# Patient Record
Sex: Male | Born: 1955 | Race: White | Hispanic: No | Marital: Married | Smoking: Never smoker
Health system: Southern US, Community
[De-identification: ages and names within clinical notes are randomized; demographics above are authoritative.]

## PROBLEM LIST (undated history)

## (undated) DIAGNOSIS — S86009A Unspecified injury of unspecified Achilles tendon, initial encounter: Secondary | ICD-10-CM

## (undated) DIAGNOSIS — K219 Gastro-esophageal reflux disease without esophagitis: Secondary | ICD-10-CM

## (undated) DIAGNOSIS — Z8489 Family history of other specified conditions: Secondary | ICD-10-CM

## (undated) DIAGNOSIS — E119 Type 2 diabetes mellitus without complications: Secondary | ICD-10-CM

## (undated) DIAGNOSIS — I10 Essential (primary) hypertension: Secondary | ICD-10-CM

## (undated) DIAGNOSIS — J189 Pneumonia, unspecified organism: Secondary | ICD-10-CM

## (undated) DIAGNOSIS — M1990 Unspecified osteoarthritis, unspecified site: Secondary | ICD-10-CM

## (undated) HISTORY — DX: Unspecified injury of unspecified achilles tendon, initial encounter: S86.009A

## (undated) HISTORY — PX: TONSILLECTOMY: SUR1361

## (undated) HISTORY — PX: CHOLECYSTECTOMY: SHX55

## (undated) HISTORY — PX: APPENDECTOMY: SHX54

---

## 1990-05-23 DIAGNOSIS — Z8719 Personal history of other diseases of the digestive system: Secondary | ICD-10-CM

## 1990-05-23 HISTORY — DX: Personal history of other diseases of the digestive system: Z87.19

## 2011-05-24 HISTORY — PX: OTHER SURGICAL HISTORY: SHX169

## 2013-01-15 DIAGNOSIS — K861 Other chronic pancreatitis: Secondary | ICD-10-CM

## 2013-01-15 DIAGNOSIS — E785 Hyperlipidemia, unspecified: Secondary | ICD-10-CM

## 2013-01-15 HISTORY — DX: Hyperlipidemia, unspecified: E78.5

## 2013-01-15 HISTORY — DX: Other chronic pancreatitis: K86.1

## 2013-07-29 DIAGNOSIS — Z9884 Bariatric surgery status: Secondary | ICD-10-CM

## 2013-07-29 HISTORY — DX: Bariatric surgery status: Z98.84

## 2014-09-04 ENCOUNTER — Other Ambulatory Visit: Payer: Self-pay | Admitting: Gastroenterology

## 2014-09-04 DIAGNOSIS — K859 Acute pancreatitis without necrosis or infection, unspecified: Secondary | ICD-10-CM

## 2014-09-04 DIAGNOSIS — R109 Unspecified abdominal pain: Secondary | ICD-10-CM

## 2014-09-10 ENCOUNTER — Other Ambulatory Visit: Payer: Self-pay

## 2014-09-14 ENCOUNTER — Ambulatory Visit
Admission: RE | Admit: 2014-09-14 | Discharge: 2014-09-14 | Disposition: A | Discharge status: Home / Self Care | Payer: BLUE CROSS/BLUE SHIELD | Source: Ambulatory Visit | Attending: Gastroenterology | Admitting: Gastroenterology

## 2014-09-14 DIAGNOSIS — K859 Acute pancreatitis without necrosis or infection, unspecified: Secondary | ICD-10-CM

## 2014-09-14 DIAGNOSIS — R109 Unspecified abdominal pain: Secondary | ICD-10-CM

## 2014-09-14 MED ORDER — GADOBENATE DIMEGLUMINE 529 MG/ML IV SOLN
20.0000 mL | Freq: Once | INTRAVENOUS | Status: AC | PRN
  Administered 2014-09-14: 20 mL via INTRAVENOUS

## 2014-09-22 ENCOUNTER — Other Ambulatory Visit: Payer: Self-pay

## 2014-11-18 ENCOUNTER — Encounter (HOSPITAL_COMMUNITY): Payer: Self-pay | Admitting: *Deleted

## 2014-11-18 ENCOUNTER — Other Ambulatory Visit (HOSPITAL_COMMUNITY): Payer: Self-pay | Admitting: *Deleted

## 2014-11-18 ENCOUNTER — Other Ambulatory Visit: Payer: Self-pay | Admitting: Surgical

## 2014-11-18 NOTE — H&P (Signed)
Joshua Monroe is an 59 y.o. male.   Chief Complaint: left ankle pain HPI: The patient presented on 11/14/2014 with acute pain and dysfunction of his left ankle following an injury while playing tennis. Symptoms are reported to be located in the left posterior ankle and include ankle pain, decreased range of motion (limited dorsiflexion) and audible pop at the time of injury. The patient reports that symptoms radiate to the left lower leg (posterior). The patient describes their symptoms as mild (varies) and does feel that they are unchanged. Symptoms are exacerbated by weight bearing and walking (especially dorsiflexion). MRI showed a complete rupture of the Achilles tendon of the left ankle with retraction.   Allergies  SandoSTATIN *ENDOCRINE AND METABOLIC AGENTS - MISC.*  Family History  Diabetes Mellitus Mother.   Social History  Children 3 Current work status working full time Exercise Exercises weekly; does individual sport Living situation live with spouse Marital status married Tobacco / smoke exposure no Tobacco use Never smoker.  Current drinker- Currently drinks wine only occasionally per week   Past Medical History Diabetes Mellitus, Type I Hypertension   Medication History  HumaLOG (100UNIT/ML Soln Cartridge, Subcutaneous daily) Active. MetFORMIN HCl (500MG Tablet, Oral daily) Active. Aciphex (20MG Tablet DR, Oral daily) Active. Creon (Oral daily) Active. (1000 mg before meals) Ibuprofen (prn) Active. Metoprolol Succinate ER (50MG Tablet ER 24HR, Oral) Active  Review of Systems  Constitutional: Negative.   HENT: Negative.   Eyes: Negative.   Respiratory: Negative.   Cardiovascular: Negative.   Genitourinary: Negative.   Musculoskeletal: Positive for joint pain and falls. Negative for myalgias, back pain and neck pain.  Skin: Negative.   Neurological: Negative.   Endo/Heme/Allergies: Negative.   Psychiatric/Behavioral: Negative.    Vitals   Weight: 207 lb Height: 73in Body Surface Area: 2.18 m Body Mass Index: 27.31 kg/m  BP: 125/76 (Sitting, Left Arm, Standard) HR: 76 bpm  Physical Exam  Constitutional: He is oriented to person, place, and time. He appears well-developed and well-nourished. No distress.  HENT:  Head: Normocephalic and atraumatic.  Right Ear: External ear normal.  Left Ear: External ear normal.  Nose: Nose normal.  Mouth/Throat: Oropharynx is clear and moist.  Eyes: Conjunctivae and EOM are normal.  Neck: Normal range of motion.  Cardiovascular: Normal rate, regular rhythm, normal heart sounds and intact distal pulses.   No murmur heard. Respiratory: Effort normal and breath sounds normal. No respiratory distress. He has no wheezes.  GI: Soft. He exhibits no distension.  Musculoskeletal:       Right knee: Normal.       Left knee: Normal.       Right ankle: Normal.       Left ankle: Achilles tendon exhibits pain, defect and abnormal Thompson's test results.  Neurological: He is alert and oriented to person, place, and time. He has normal strength. No sensory deficit.  Skin: No rash noted. He is not diaphoretic. No erythema.  Psychiatric: He has a normal mood and affect. His behavior is normal.     Assessment/Plan Left Achilles tendon rupture with retraction He needs an open left Achilles tendon repair with graft and possibly anchors. Risks and benefits of the procedure discussed with the patient by Dr. Gioffre.   H&P performed by Dr. Ronald Gioffre, MD  Blong Busk LAUREN 11/18/2014, 10:51 AM    

## 2014-11-18 NOTE — Patient Instructions (Addendum)
Joshua Monroe  11/18/2014   Your procedure is scheduled on: Thursday June 30th, 2016  [facility]  Entrance take [place]  elevators to 3rd floor to  Short Stay Center at 1030 AM.  Call this number if you have problems the morning of surgery [phone]   Remember: ONLY 1 PERSON MAY GO WITH YOU TO SHORT STAY TO GET  READY MORNING OF YOUR SURGERY.  Do not eat food or drink liquids :After Midnight.   BRING INSULIN PUMP SUPPLIES   Take these medicines the morning of surgery with A SIP OF WATER: Aciphex, metoprolol succinate                               You may not have any metal on your body including hair pins and              piercings  Do not wear jewelry, make-up, lotions, powders or perfumes, deodorant             Do not wear nail polish.  Do not shave  48 hours prior to surgery.              Men may shave face and neck.   Do not bring valuables to the hospital. [facility] IS NOT             RESPONSIBLE   FOR VALUABLES.  Contacts, dentures or bridgework may not be worn into surgery.  Leave suitcase in the car. After surgery it may be brought to your room.                  Please read over the following fact sheets you were given: _____________________________________________________________________             [facility] - Pre[facility] Before surgery, you can play an important role.  Because skin is not sterile, your skin needs to be as free of germs as possible.  You can reduce the number of germs on your skin by washing with CHG (chlorahexidine gluconate) soap before surgery.  CHG is an antiseptic cleaner which kills germs and bonds with the skin to continue killing germs even after washing. Please DO NOT use if you have an allergy to CHG or antibacterial soaps.  If your skin becomes reddened/irritated stop using the CHG and inform your nurse when you arrive at Short Stay. Do not shave (including legs and underarms) for at least  48 hours prior to the first CHG shower.  You may shave your face/neck. Please follow these instructions carefully:  1.  Shower with CHG Soap the night before surgery and the  morning of Surgery.  2.  If you choose to wash your hair, wash your hair first as usual with your  normal  shampoo.  3.  After you shampoo, rinse your hair and body thoroughly to remove the  shampoo.                           4.  Use CHG as you would any other liquid soap.  You can apply chg directly  to the skin and wash                       Gently with a scrungie or clean washcloth.  5.  Apply the CHG Soap to your body ONLY FROM THE NECK DOWN.   Do not use on face/ open                           Wound or open sores. Avoid contact with eyes, ears mouth and genitals (private parts).                       Wash face,  Genitals (private parts) with your normal soap.             6.  Wash thoroughly, paying special attention to the area where your surgery  will be performed.  7.  Thoroughly rinse your body with warm water from the neck down.  8.  DO NOT shower/wash with your normal soap after using and rinsing off  the CHG Soap.                9.  Pat yourself dry with a clean towel.            10.  Wear clean pajamas.            11.  Place clean sheets on your bed the night of your first shower and do not  sleep with pets. Day of Surgery : Do not apply any lotions/deodorants the morning of surgery.  Please wear clean clothes to the hospital/surgery center.  FAILURE TO FOLLOW THESE INSTRUCTIONS MAY RESULT IN THE CANCELLATION OF YOUR SURGERY PATIENT SIGNATURE_________________________________  NURSE SIGNATURE__________________________________  ________________________________________________________________________   Joshua Monroe  An incentive spirometer is a tool that can help keep your lungs clear and active. This tool measures how well you are filling your lungs with each breath. Taking long deep breaths may  help reverse or decrease the chance of developing breathing (pulmonary) problems (especially infection) following:  A long period of time when you are unable to move or be active. BEFORE THE PROCEDURE   If the spirometer includes an indicator to show your best effort, your nurse or respiratory therapist will set it to a desired goal.  If possible, sit up straight or lean slightly forward. Try not to slouch.  Hold the incentive spirometer in an upright position. INSTRUCTIONS FOR USE  1. Sit on the edge of your bed if possible, or sit up as far as you can in bed or on a chair. 2. Hold the incentive spirometer in an upright position. 3. Breathe out normally. 4. Place the mouthpiece in your mouth and seal your lips tightly around it. 5. Breathe in slowly and as deeply as possible, raising the piston or the ball toward the top of the column. 6. Hold your breath for 3-5 seconds or for as long as possible. Allow the piston or ball to fall to the bottom of the column. 7. Remove the mouthpiece from your mouth and breathe out normally. 8. Rest for a few seconds and repeat Steps 1 through 7 at least 10 times every 1-2 hours when you are awake. Take your time and take a few normal breaths between deep breaths. 9. The spirometer may include an indicator to show your best effort. Use the indicator as a goal to work toward during each repetition. 10. After each set of 10 deep breaths, practice coughing to be sure your lungs are clear. If you have an incision (the cut made at the time of surgery), support your incision when coughing by placing a pillow or  rolled up towels firmly against it. Once you are able to get out of bed, walk around indoors and cough well. You may stop using the incentive spirometer when instructed by your caregiver.  RISKS AND COMPLICATIONS  Take your time so you do not get dizzy or light-headed.  If you are in pain, you may need to take or ask for pain medication before doing  incentive spirometry. It is harder to take a deep breath if you are having pain. AFTER USE  Rest and breathe slowly and easily.  It can be helpful to keep track of a log of your progress. Your caregiver can provide you with a simple table to help with this. If you are using the spirometer at home, follow these instructions: [place] IF:   You are having difficultly using the spirometer.  You have trouble using the spirometer as often as instructed.  Your pain medication is not giving enough relief while using the spirometer.  You develop fever of 100.5 F (38.1 C) or higher. SEEK IMMEDIATE MEDICAL CARE IF:   You cough up bloody sputum that had not been present before.  You develop fever of 102 F (38.9 C) or greater.  You develop worsening pain at or near the incision site. MAKE SURE YOU:   Understand these instructions.  Will watch your condition.  Will get help right away if you are not doing well or get worse. Document Released: 09/19/2006 Document Revised: 08/01/2011 Document Reviewed: 11/20/2006 [facility] Patient Information 2014 [place], [place].   ________________________________________________________________________

## 2014-11-19 ENCOUNTER — Encounter (HOSPITAL_COMMUNITY)
Admission: RE | Admit: 2014-11-19 | Discharge: 2014-11-19 | Disposition: A | Discharge status: Home / Self Care | Payer: BLUE CROSS/BLUE SHIELD | Source: Ambulatory Visit | Attending: Orthopedic Surgery | Admitting: Orthopedic Surgery

## 2014-11-19 ENCOUNTER — Encounter (HOSPITAL_COMMUNITY): Payer: Self-pay

## 2014-11-19 DIAGNOSIS — Y9373 Activity, racquet and hand sports: Secondary | ICD-10-CM | POA: Diagnosis not present

## 2014-11-19 DIAGNOSIS — Y92312 Tennis court as the place of occurrence of the external cause: Secondary | ICD-10-CM | POA: Diagnosis not present

## 2014-11-19 DIAGNOSIS — Z79899 Other long term (current) drug therapy: Secondary | ICD-10-CM | POA: Diagnosis not present

## 2014-11-19 DIAGNOSIS — I10 Essential (primary) hypertension: Secondary | ICD-10-CM | POA: Diagnosis not present

## 2014-11-19 DIAGNOSIS — E109 Type 1 diabetes mellitus without complications: Secondary | ICD-10-CM | POA: Diagnosis not present

## 2014-11-19 DIAGNOSIS — X58XXXA Exposure to other specified factors, initial encounter: Secondary | ICD-10-CM | POA: Diagnosis not present

## 2014-11-19 DIAGNOSIS — Z794 Long term (current) use of insulin: Secondary | ICD-10-CM | POA: Diagnosis not present

## 2014-11-19 DIAGNOSIS — S86012A Strain of left Achilles tendon, initial encounter: Secondary | ICD-10-CM | POA: Diagnosis not present

## 2014-11-19 LAB — CBC WITH DIFFERENTIAL/PLATELET
Basophils Absolute: 0 10*3/uL (ref 0.0–0.1)
Basophils Relative: 0 % (ref 0–1)
Eosinophils Absolute: 0.1 10*3/uL (ref 0.0–0.7)
Eosinophils Relative: 1 % (ref 0–5)
HCT: 36.5 % — ABNORMAL LOW (ref 39.0–52.0)
Hemoglobin: 11.6 g/dL — ABNORMAL LOW (ref 13.0–17.0)
Lymphocytes Relative: 10 % — ABNORMAL LOW (ref 12–46)
Lymphs Abs: 1.2 10*3/uL (ref 0.7–4.0)
MCH: 26.2 pg (ref 26.0–34.0)
MCHC: 31.8 g/dL (ref 30.0–36.0)
MCV: 82.4 fL (ref 78.0–100.0)
Monocytes Absolute: 0.9 10*3/uL (ref 0.1–1.0)
Monocytes Relative: 7 % (ref 3–12)
Neutro Abs: 10.1 10*3/uL — ABNORMAL HIGH (ref 1.7–7.7)
Neutrophils Relative %: 82 % — ABNORMAL HIGH (ref 43–77)
Platelets: 233 10*3/uL (ref 150–400)
RBC: 4.43 MIL/uL (ref 4.22–5.81)
RDW: 14.1 % (ref 11.5–15.5)
WBC: 12.3 10*3/uL — ABNORMAL HIGH (ref 4.0–10.5)

## 2014-11-19 LAB — COMPREHENSIVE METABOLIC PANEL
ALT: 15 U/L — ABNORMAL LOW (ref 17–63)
AST: 20 U/L (ref 15–41)
Albumin: 4.2 g/dL (ref 3.5–5.0)
Alkaline Phosphatase: 87 U/L (ref 38–126)
Anion gap: 10 (ref 5–15)
BUN: 26 mg/dL — ABNORMAL HIGH (ref 6–20)
CO2: 24 mmol/L (ref 22–32)
Calcium: 9.7 mg/dL (ref 8.9–10.3)
Chloride: 105 mmol/L (ref 101–111)
Creatinine, Ser: 1.02 mg/dL (ref 0.61–1.24)
GFR calc Af Amer: >60 mL/min (ref >60)
GFR calc non Af Amer: >60 mL/min (ref >60)
Glucose, Bld: 165 mg/dL — ABNORMAL HIGH (ref 65–99)
Potassium: 4.6 mmol/L (ref 3.5–5.1)
Sodium: 139 mmol/L (ref 135–145)
Total Bilirubin: 0.6 mg/dL (ref 0.3–1.2)
Total Protein: 7.2 g/dL (ref 6.5–8.1)

## 2014-11-19 LAB — PROTIME-INR
INR: 1.03 (ref 0.00–1.49)
Prothrombin Time: 13.7 seconds (ref 11.6–15.2)

## 2014-11-19 NOTE — Progress Notes (Signed)
Patient instructed to bring own insulin pump supplies and to leave pump running at basal rate  pre op phone call on 11-18-14 made diabetic coordinator jenny simpson  aware of patient surgery 11-20-14 1230 pm  Overnight admission.

## 2014-11-20 ENCOUNTER — Ambulatory Visit (HOSPITAL_COMMUNITY): Payer: BLUE CROSS/BLUE SHIELD | Admitting: Anesthesiology

## 2014-11-20 ENCOUNTER — Observation Stay (HOSPITAL_COMMUNITY)
Admission: RE | Admit: 2014-11-20 | Discharge: 2014-11-21 | Disposition: A | Discharge status: Home / Self Care | Payer: BLUE CROSS/BLUE SHIELD | Source: Ambulatory Visit | Attending: Orthopedic Surgery | Admitting: Orthopedic Surgery

## 2014-11-20 ENCOUNTER — Encounter (HOSPITAL_COMMUNITY): Payer: Self-pay | Admitting: *Deleted

## 2014-11-20 ENCOUNTER — Encounter (HOSPITAL_COMMUNITY)
Admission: RE | Disposition: A | Discharge status: Home / Self Care | Payer: Self-pay | Source: Ambulatory Visit | Attending: Orthopedic Surgery

## 2014-11-20 DIAGNOSIS — Y92312 Tennis court as the place of occurrence of the external cause: Secondary | ICD-10-CM | POA: Insufficient documentation

## 2014-11-20 DIAGNOSIS — Y9373 Activity, racquet and hand sports: Secondary | ICD-10-CM | POA: Insufficient documentation

## 2014-11-20 DIAGNOSIS — Z79899 Other long term (current) drug therapy: Secondary | ICD-10-CM | POA: Insufficient documentation

## 2014-11-20 DIAGNOSIS — E109 Type 1 diabetes mellitus without complications: Secondary | ICD-10-CM | POA: Insufficient documentation

## 2014-11-20 DIAGNOSIS — S86012A Strain of left Achilles tendon, initial encounter: Secondary | ICD-10-CM | POA: Diagnosis not present

## 2014-11-20 DIAGNOSIS — I10 Essential (primary) hypertension: Secondary | ICD-10-CM | POA: Insufficient documentation

## 2014-11-20 DIAGNOSIS — X58XXXA Exposure to other specified factors, initial encounter: Secondary | ICD-10-CM | POA: Insufficient documentation

## 2014-11-20 DIAGNOSIS — S86009A Unspecified injury of unspecified Achilles tendon, initial encounter: Secondary | ICD-10-CM

## 2014-11-20 DIAGNOSIS — S86002D Unspecified injury of left Achilles tendon, subsequent encounter: Secondary | ICD-10-CM

## 2014-11-20 DIAGNOSIS — Z794 Long term (current) use of insulin: Secondary | ICD-10-CM | POA: Insufficient documentation

## 2014-11-20 HISTORY — DX: Essential (primary) hypertension: I10

## 2014-11-20 HISTORY — DX: Type 2 diabetes mellitus without complications: E11.9

## 2014-11-20 HISTORY — PX: ACHILLES TENDON SURGERY: SHX542

## 2014-11-20 HISTORY — DX: Unspecified injury of unspecified achilles tendon, initial encounter: S86.009A

## 2014-11-20 HISTORY — DX: Unspecified osteoarthritis, unspecified site: M19.90

## 2014-11-20 HISTORY — DX: Gastro-esophageal reflux disease without esophagitis: K21.9

## 2014-11-20 LAB — CBC
HCT: 32.8 % — ABNORMAL LOW (ref 39.0–52.0)
HEMOGLOBIN: 10.5 g/dL — AB (ref 13.0–17.0)
MCH: 26.2 pg (ref 26.0–34.0)
MCHC: 32 g/dL (ref 30.0–36.0)
MCV: 81.8 fL (ref 78.0–100.0)
Platelets: 187 10*3/uL (ref 150–400)
RBC: 4.01 MIL/uL — ABNORMAL LOW (ref 4.22–5.81)
RDW: 14 % (ref 11.5–15.5)
WBC: 7.4 10*3/uL (ref 4.0–10.5)

## 2014-11-20 LAB — CREATININE, SERUM
Creatinine, Ser: 1.01 mg/dL (ref 0.61–1.24)
GFR calc Af Amer: >60 mL/min (ref >60)
GFR calc non Af Amer: >60 mL/min (ref >60)

## 2014-11-20 LAB — GLUCOSE, CAPILLARY
Glucose-Capillary: 172 mg/dL — ABNORMAL HIGH (ref 65–99)
Glucose-Capillary: 92 mg/dL (ref 65–99)
Glucose-Capillary: 78 mg/dL (ref 65–99)
Glucose-Capillary: 88 mg/dL (ref 65–99)
Glucose-Capillary: 99 mg/dL (ref 65–99)
Glucose-Capillary: 115 mg/dL — ABNORMAL HIGH (ref 65–99)

## 2014-11-20 SURGERY — REPAIR, TENDON, ACHILLES
Anesthesia: General | Laterality: Left

## 2014-11-20 MED ORDER — SODIUM CHLORIDE 0.9 % IR SOLN
Status: DC | PRN
  Administered 2014-11-20: 500 mL

## 2014-11-20 MED ORDER — ONDANSETRON HCL 4 MG/2ML IJ SOLN
INTRAMUSCULAR | Status: AC
  Filled 2014-11-20: qty 2

## 2014-11-20 MED ORDER — METOPROLOL SUCCINATE ER 50 MG PO TB24
50.0000 mg | ORAL_TABLET | Freq: Every day | ORAL | Status: DC
  Administered 2014-11-21: 50 mg via ORAL
  Filled 2014-11-20 (×2): qty 1

## 2014-11-20 MED ORDER — BUPIVACAINE HCL (PF) 0.5 % IJ SOLN
INTRAMUSCULAR | Status: AC
Start: 2014-11-20 — End: 2014-11-20
  Filled 2014-11-20: qty 30

## 2014-11-20 MED ORDER — METHOCARBAMOL 500 MG PO TABS
500.0000 mg | ORAL_TABLET | Freq: Four times a day (QID) | ORAL | Status: DC | PRN
  Administered 2014-11-20 – 2014-11-21 (×2): 500 mg via ORAL
  Filled 2014-11-20 (×2): qty 1

## 2014-11-20 MED ORDER — POLYETHYLENE GLYCOL 3350 17 G PO PACK: 17.0000 g | PACK | Freq: Every day | ORAL | Status: DC | PRN

## 2014-11-20 MED ORDER — LIDOCAINE HCL (CARDIAC) 20 MG/ML IV SOLN
INTRAVENOUS | Status: DC | PRN
  Administered 2014-11-20: 50 mg via INTRAVENOUS

## 2014-11-20 MED ORDER — CEFAZOLIN SODIUM-DEXTROSE 2-3 GM-% IV SOLR
2.0000 g | INTRAVENOUS | Status: AC
  Administered 2014-11-20: 2 g via INTRAVENOUS

## 2014-11-20 MED ORDER — EPHEDRINE SULFATE 50 MG/ML IJ SOLN
INTRAMUSCULAR | Status: DC | PRN
  Administered 2014-11-20: 5 mg via INTRAVENOUS

## 2014-11-20 MED ORDER — INSULIN ASPART 100 UNIT/ML ~~LOC~~ SOLN: 0.0000 [IU] | Freq: Three times a day (TID) | SUBCUTANEOUS | Status: DC

## 2014-11-20 MED ORDER — GLYCOPYRROLATE 0.2 MG/ML IJ SOLN
INTRAMUSCULAR | Status: AC
  Filled 2014-11-20: qty 3

## 2014-11-20 MED ORDER — ACETAMINOPHEN 650 MG RE SUPP: 650.0000 mg | Freq: Four times a day (QID) | RECTAL | Status: DC | PRN

## 2014-11-20 MED ORDER — HYDROMORPHONE HCL 1 MG/ML IJ SOLN: 0.2500 mg | INTRAMUSCULAR | Status: DC | PRN

## 2014-11-20 MED ORDER — LACTATED RINGERS IV SOLN: INTRAVENOUS | Status: DC

## 2014-11-20 MED ORDER — MIDAZOLAM HCL 5 MG/5ML IJ SOLN
INTRAMUSCULAR | Status: DC | PRN
  Administered 2014-11-20: 2 mg via INTRAVENOUS

## 2014-11-20 MED ORDER — METHOCARBAMOL 1000 MG/10ML IJ SOLN
500.0000 mg | Freq: Four times a day (QID) | INTRAVENOUS | Status: DC | PRN
  Administered 2014-11-20: 500 mg via INTRAVENOUS
  Filled 2014-11-20 (×3): qty 5

## 2014-11-20 MED ORDER — PROPOFOL 10 MG/ML IV BOLUS
INTRAVENOUS | Status: DC | PRN
  Administered 2014-11-20: 180 mg via INTRAVENOUS

## 2014-11-20 MED ORDER — HYDROCODONE-ACETAMINOPHEN 5-325 MG PO TABS
1.0000 | ORAL_TABLET | ORAL | Status: DC | PRN
  Administered 2014-11-20: 2 via ORAL
  Administered 2014-11-20: 1 via ORAL
  Administered 2014-11-21: 2 via ORAL
  Filled 2014-11-20: qty 1
  Filled 2014-11-20 (×2): qty 2

## 2014-11-20 MED ORDER — METOCLOPRAMIDE HCL 10 MG PO TABS: 5.0000 mg | ORAL_TABLET | Freq: Three times a day (TID) | ORAL | Status: DC | PRN

## 2014-11-20 MED ORDER — INSULIN PUMP
Freq: Three times a day (TID) | SUBCUTANEOUS | Status: DC
  Administered 2014-11-20: 4 via SUBCUTANEOUS
  Administered 2014-11-20 – 2014-11-21 (×2): via SUBCUTANEOUS
  Filled 2014-11-20: qty 1

## 2014-11-20 MED ORDER — ONDANSETRON HCL 4 MG/2ML IJ SOLN
INTRAMUSCULAR | Status: DC | PRN
  Administered 2014-11-20: 4 mg via INTRAVENOUS

## 2014-11-20 MED ORDER — METOCLOPRAMIDE HCL 5 MG/ML IJ SOLN: 5.0000 mg | Freq: Three times a day (TID) | INTRAMUSCULAR | Status: DC | PRN

## 2014-11-20 MED ORDER — LIDOCAINE HCL (CARDIAC) 20 MG/ML IV SOLN
INTRAVENOUS | Status: AC
  Filled 2014-11-20: qty 5

## 2014-11-20 MED ORDER — ROCURONIUM BROMIDE 100 MG/10ML IV SOLN
INTRAVENOUS | Status: DC | PRN
  Administered 2014-11-20: 40 mg via INTRAVENOUS

## 2014-11-20 MED ORDER — FENTANYL CITRATE (PF) 250 MCG/5ML IJ SOLN
INTRAMUSCULAR | Status: AC
Start: 2014-11-20 — End: 2014-11-20
  Filled 2014-11-20: qty 5

## 2014-11-20 MED ORDER — LACTATED RINGERS IV SOLN
INTRAVENOUS | Status: DC
  Administered 2014-11-20: 1000 mL via INTRAVENOUS

## 2014-11-20 MED ORDER — ENOXAPARIN SODIUM 40 MG/0.4ML ~~LOC~~ SOLN
40.0000 mg | SUBCUTANEOUS | Status: DC
  Administered 2014-11-21: 40 mg via SUBCUTANEOUS
  Filled 2014-11-20 (×2): qty 0.4

## 2014-11-20 MED ORDER — GLYCOPYRROLATE 0.2 MG/ML IJ SOLN
INTRAMUSCULAR | Status: DC | PRN
  Administered 2014-11-20: .6 mg via INTRAVENOUS

## 2014-11-20 MED ORDER — MIDAZOLAM HCL 2 MG/2ML IJ SOLN
INTRAMUSCULAR | Status: AC
Start: 2014-11-20 — End: 2014-11-20
  Filled 2014-11-20: qty 2

## 2014-11-20 MED ORDER — PANTOPRAZOLE SODIUM 40 MG PO TBEC
40.0000 mg | DELAYED_RELEASE_TABLET | Freq: Every day | ORAL | Status: DC
  Administered 2014-11-21: 40 mg via ORAL
  Filled 2014-11-20: qty 1

## 2014-11-20 MED ORDER — SODIUM CHLORIDE 0.9 % IR SOLN
Status: AC
  Filled 2014-11-20: qty 1

## 2014-11-20 MED ORDER — BUPIVACAINE HCL (PF) 0.5 % IJ SOLN
INTRAMUSCULAR | Status: DC | PRN
  Administered 2014-11-20: 7 mL

## 2014-11-20 MED ORDER — BACITRACIN-NEOMYCIN-POLYMYXIN 400-5-5000 EX OINT
TOPICAL_OINTMENT | CUTANEOUS | Status: AC
  Filled 2014-11-20: qty 1

## 2014-11-20 MED ORDER — EPHEDRINE SULFATE 50 MG/ML IJ SOLN
INTRAMUSCULAR | Status: AC
  Filled 2014-11-20: qty 1

## 2014-11-20 MED ORDER — HYDROMORPHONE HCL 1 MG/ML IJ SOLN
0.5000 mg | INTRAMUSCULAR | Status: DC | PRN
  Administered 2014-11-20 (×2): 0.5 mg via INTRAVENOUS
  Filled 2014-11-20 (×2): qty 1

## 2014-11-20 MED ORDER — LACTATED RINGERS IV SOLN
INTRAVENOUS | Status: DC
  Administered 2014-11-20: 12:00:00 via INTRAVENOUS

## 2014-11-20 MED ORDER — ACETAMINOPHEN 325 MG PO TABS
650.0000 mg | ORAL_TABLET | Freq: Four times a day (QID) | ORAL | Status: DC | PRN
  Administered 2014-11-21: 650 mg via ORAL
  Filled 2014-11-20: qty 2

## 2014-11-20 MED ORDER — FLEET ENEMA 7-19 GM/118ML RE ENEM: 1.0000 | ENEMA | Freq: Once | RECTAL | Status: AC | PRN

## 2014-11-20 MED ORDER — CEFAZOLIN SODIUM-DEXTROSE 2-3 GM-% IV SOLR
INTRAVENOUS | Status: AC
  Filled 2014-11-20: qty 50

## 2014-11-20 MED ORDER — NEOSTIGMINE METHYLSULFATE 10 MG/10ML IV SOLN
INTRAVENOUS | Status: DC | PRN
  Administered 2014-11-20: 4 mg via INTRAVENOUS

## 2014-11-20 MED ORDER — ONDANSETRON HCL 4 MG PO TABS: 4.0000 mg | ORAL_TABLET | Freq: Four times a day (QID) | ORAL | Status: DC | PRN

## 2014-11-20 MED ORDER — NEOSTIGMINE METHYLSULFATE 10 MG/10ML IV SOLN
INTRAVENOUS | Status: AC
Start: 2014-11-20 — End: 2014-11-20
  Filled 2014-11-20: qty 1

## 2014-11-20 MED ORDER — CEFAZOLIN SODIUM 1-5 GM-% IV SOLN
1.0000 g | Freq: Four times a day (QID) | INTRAVENOUS | Status: AC
  Administered 2014-11-20 – 2014-11-21 (×3): 1 g via INTRAVENOUS
  Filled 2014-11-20 (×3): qty 50

## 2014-11-20 MED ORDER — LACTATED RINGERS IV SOLN
INTRAVENOUS | Status: DC
  Administered 2014-11-20: 19:00:00 via INTRAVENOUS

## 2014-11-20 MED ORDER — PROPOFOL 10 MG/ML IV BOLUS
INTRAVENOUS | Status: AC
  Filled 2014-11-20: qty 20

## 2014-11-20 MED ORDER — OXYCODONE-ACETAMINOPHEN 5-325 MG PO TABS: 2.0000 | ORAL_TABLET | ORAL | Status: DC | PRN

## 2014-11-20 MED ORDER — ONDANSETRON HCL 4 MG/2ML IJ SOLN: 4.0000 mg | Freq: Four times a day (QID) | INTRAMUSCULAR | Status: DC | PRN

## 2014-11-20 MED ORDER — BISACODYL 5 MG PO TBEC: 5.0000 mg | DELAYED_RELEASE_TABLET | Freq: Every day | ORAL | Status: DC | PRN

## 2014-11-20 MED ORDER — FENTANYL CITRATE (PF) 100 MCG/2ML IJ SOLN
INTRAMUSCULAR | Status: DC | PRN
  Administered 2014-11-20: 100 ug via INTRAVENOUS
  Administered 2014-11-20 (×3): 50 ug via INTRAVENOUS

## 2014-11-20 SURGICAL SUPPLY — 52 items
BAG ZIPLOCK 12X15 (MISCELLANEOUS) ×3 IMPLANT
BANDAGE ESMARK 6X9 LF (GAUZE/BANDAGES/DRESSINGS) ×1 IMPLANT
BENZOIN TINCTURE PRP APPL 2/3 (GAUZE/BANDAGES/DRESSINGS) IMPLANT
BIT DRILL 1.6MX128 (BIT) ×2 IMPLANT
BIT DRILL 1.6MX128MM (BIT) ×1
BIT DRILL 2.8X128 (BIT) ×2 IMPLANT
BIT DRILL 2.8X128MM (BIT) ×1
BNDG ESMARK 6X9 LF (GAUZE/BANDAGES/DRESSINGS) ×3
CLOSURE WOUND 1/2 X4 (GAUZE/BANDAGES/DRESSINGS)
CUFF TOURN SGL QUICK 34 (TOURNIQUET CUFF) ×2
CUFF TRNQT CYL 34X4X40X1 (TOURNIQUET CUFF) ×1 IMPLANT
DERMASPAN .5-.9MM 4X4CM SHOU (Miscellaneous) ×3 IMPLANT
DRSG EMULSION OIL 3X16 NADH (GAUZE/BANDAGES/DRESSINGS) ×3 IMPLANT
DRSG PAD ABDOMINAL 8X10 ST (GAUZE/BANDAGES/DRESSINGS) ×3 IMPLANT
DURAPREP 26ML APPLICATOR (WOUND CARE) ×3 IMPLANT
ELECT REM PT RETURN 9FT ADLT (ELECTROSURGICAL) ×6
ELECTRODE REM PT RTRN 9FT ADLT (ELECTROSURGICAL) ×2 IMPLANT
GAUZE SPONGE 4X4 12PLY STRL (GAUZE/BANDAGES/DRESSINGS) ×3 IMPLANT
GLOVE BIOGEL PI IND STRL 6.5 (GLOVE) ×2 IMPLANT
GLOVE BIOGEL PI IND STRL 8 (GLOVE) ×1 IMPLANT
GLOVE BIOGEL PI INDICATOR 6.5 (GLOVE) ×4
GLOVE BIOGEL PI INDICATOR 8 (GLOVE) ×2
GLOVE ECLIPSE 8.0 STRL XLNG CF (GLOVE) ×6 IMPLANT
GOWN STRL REUS W/TWL LRG LVL3 (GOWN DISPOSABLE) ×3 IMPLANT
GOWN STRL REUS W/TWL XL LVL3 (GOWN DISPOSABLE) ×6 IMPLANT
KIT ANCHOR IMP ACH MID SPEED P (Orthopedic Implant) ×3 IMPLANT
MANIFOLD NEPTUNE II (INSTRUMENTS) ×3 IMPLANT
NEEDLE HYPO 22GX1.5 SAFETY (NEEDLE) ×3 IMPLANT
NEEDLE MAYO .5 CIRCLE (NEEDLE) ×3 IMPLANT
PACK ORTHO EXTREMITY (CUSTOM PROCEDURE TRAY) ×3 IMPLANT
PAD ABD 8X10 STRL (GAUZE/BANDAGES/DRESSINGS) ×3 IMPLANT
PAD CAST 4YDX4 CTTN HI CHSV (CAST SUPPLIES) ×2 IMPLANT
PADDING CAST ABS 4INX4YD NS (CAST SUPPLIES) ×4
PADDING CAST ABS COTTON 4X4 ST (CAST SUPPLIES) ×2 IMPLANT
PADDING CAST COTTON 4X4 STRL (CAST SUPPLIES) ×4
PADDING CAST COTTON 6X4 STRL (CAST SUPPLIES) IMPLANT
PASSER SUT SWANSON 36MM LOOP (INSTRUMENTS) IMPLANT
POSITIONER SURGICAL ARM (MISCELLANEOUS) ×3 IMPLANT
SCOTCHCAST PLUS 4X4 WHITE (CAST SUPPLIES) IMPLANT
SPLINT FIBERGLASS 3X35 (CAST SUPPLIES) ×3 IMPLANT
STAPLER VISISTAT (STAPLE) ×3 IMPLANT
STRIP CLOSURE SKIN 1/2X4 (GAUZE/BANDAGES/DRESSINGS) IMPLANT
SUCTION FRAZIER 12FR DISP (SUCTIONS) ×3 IMPLANT
SUT ETHIBOND NAB CT1 #1 30IN (SUTURE) ×9 IMPLANT
SUT ETHILON 3 0 PS 1 (SUTURE) ×12 IMPLANT
SUT VIC AB 1 CT1 27 (SUTURE) ×4
SUT VIC AB 1 CT1 27XBRD ANTBC (SUTURE) ×2 IMPLANT
SUT VIC AB 2-0 CT1 27 (SUTURE) ×4
SUT VIC AB 2-0 CT1 TAPERPNT 27 (SUTURE) ×2 IMPLANT
SYR CONTROL 10ML LL (SYRINGE) ×3 IMPLANT
TOWEL OR 17X26 10 PK STRL BLUE (TOWEL DISPOSABLE) ×3 IMPLANT
TOWEL OR NON WOVEN STRL DISP B (DISPOSABLE) ×3 IMPLANT

## 2014-11-20 NOTE — Anesthesia Postprocedure Evaluation (Signed)
  Anesthesia Post-op Note  Patient: Joshua Monroe  Procedure(s) Performed: Procedure(s) (LRB): OPEN REPAIR OF A LEFT ACHILLES TENDON RUPTURE (Left)  Patient Location: PACU  Anesthesia Type: General  Level of Consciousness: awake and alert   Airway and Oxygen Therapy: Patient Spontanous Breathing  Post-op Pain: mild  Post-op Assessment: Post-op Vital signs reviewed, Patient's Cardiovascular Status Stable, Respiratory Function Stable, Patent Airway and No signs of Nausea or vomiting  Last Vitals:  Filed Vitals:   11/20/14 1513  BP: 133/66  Pulse: 60  Temp: 36.4 C  Resp:     Post-op Vital Signs: stable   Complications: No apparent anesthesia complications

## 2014-11-20 NOTE — Anesthesia Preprocedure Evaluation (Addendum)
Anesthesia Evaluation  Patient identified by MRN, date of birth, ID band Patient awake    Reviewed: Allergy & Precautions, H&P , NPO status , Patient's Chart, lab work & pertinent test results, reviewed documented beta blocker date and time   Airway Mallampati: II  TM Distance: >3 FB Neck ROM: full    Dental  (+) Dental Advisory Given, Caps Left upper front capped:   Pulmonary neg pulmonary ROS,  breath sounds clear to auscultation  Pulmonary exam normal       Cardiovascular Exercise Tolerance: Good hypertension, Pt. on home beta blockers Normal cardiovascular examRhythm:regular Rate:Normal     Neuro/Psych negative neurological ROS  negative psych ROS   GI/Hepatic negative GI ROS, Neg liver ROS, GERD-  Medicated and Controlled,  Endo/Other  diabetes, Well Controlled, Type 1, Insulin DependentInsulin pump  Renal/GU negative Renal ROS  negative genitourinary   Musculoskeletal   Abdominal   Peds  Hematology negative hematology ROS (+)   Anesthesia Other Findings   Reproductive/Obstetrics negative OB ROS                            Anesthesia Physical Anesthesia Plan  ASA: III  Anesthesia Plan: General   Post-op Pain Management:    Induction: Intravenous  Airway Management Planned: Oral ETT  Additional Equipment:   Intra-op Plan:   Post-operative Plan: Extubation in OR  Informed Consent: I have reviewed the patients History and Physical, chart, labs and discussed the procedure including the risks, benefits and alternatives for the proposed anesthesia with the patient or authorized representative who has indicated his/her understanding and acceptance.   Dental Advisory Given  Plan Discussed with: CRNA and Surgeon  Anesthesia Plan Comments:         Anesthesia Quick Evaluation

## 2014-11-20 NOTE — Anesthesia Procedure Notes (Signed)
Procedure Name: Intubation Date/Time: 11/20/2014 12:33 PM Performed by: Paris LoreBLANTON, Akul Leggette M Pre-anesthesia Checklist: Patient identified, Emergency Drugs available, Suction available, Patient being monitored and Timeout performed Patient Re-evaluated:Patient Re-evaluated prior to inductionOxygen Delivery Method: Circle system utilized Preoxygenation: Pre-oxygenation with 100% oxygen Intubation Type: IV induction Ventilation: Mask ventilation without difficulty Laryngoscope Size: Mac and 4 Grade View: Grade II Tube type: Oral Tube size: 7.5 mm Number of attempts: 1 Airway Equipment and Method: Stylet Placement Confirmation: ETT inserted through vocal cords under direct vision,  positive ETCO2,  CO2 detector and breath sounds checked- equal and bilateral Secured at: 23 cm Tube secured with: Tape Dental Injury: Teeth and Oropharynx as per pre-operative assessment

## 2014-11-20 NOTE — H&P (View-Only) (Signed)
Joshua LericheMark Biggins is an 59 y.o. male.   Chief Complaint: left ankle pain HPI: The patient presented on 11/14/2014 with acute pain and dysfunction of his left ankle following an injury while playing tennis. Symptoms are reported to be located in the left posterior ankle and include ankle pain, decreased range of motion (limited dorsiflexion) and audible pop at the time of injury. The patient reports that symptoms radiate to the left lower leg (posterior). The patient describes their symptoms as mild (varies) and does feel that they are unchanged. Symptoms are exacerbated by weight bearing and walking (especially dorsiflexion). MRI showed a complete rupture of the Achilles tendon of the left ankle with retraction.   Allergies  SandoSTATIN *ENDOCRINE AND METABOLIC AGENTS - MISC.*  Family History  Diabetes Mellitus Mother.   Social History  Children 3 Current work status working full time Biochemist, clinicalxercise Exercises weekly; does individual sport Living situation live with spouse Marital status married Tobacco / smoke exposure no Tobacco use Never smoker.  Current drinker- Currently drinks wine only occasionally per week   Past Medical History Diabetes Mellitus, Type I Hypertension   Medication History  HumaLOG (100UNIT/ML Soln Cartridge, Subcutaneous daily) Active. MetFORMIN HCl (500MG  Tablet, Oral daily) Active. Aciphex (20MG  Tablet DR, Oral daily) Active. Creon (Oral daily) Active. (1000 mg before meals) Ibuprofen (prn) Active. Metoprolol Succinate ER (50MG  Tablet ER 24HR, Oral) Active  Review of Systems  Constitutional: Negative.   HENT: Negative.   Eyes: Negative.   Respiratory: Negative.   Cardiovascular: Negative.   Genitourinary: Negative.   Musculoskeletal: Positive for joint pain and falls. Negative for myalgias, back pain and neck pain.  Skin: Negative.   Neurological: Negative.   Endo/Heme/Allergies: Negative.   Psychiatric/Behavioral: Negative.    Vitals   Weight: 207 lb Height: 73in Body Surface Area: 2.18 m Body Mass Index: 27.31 kg/m  BP: 125/76 (Sitting, Left Arm, Standard) HR: 76 bpm  Physical Exam  Constitutional: He is oriented to person, place, and time. He appears well-developed and well-nourished. No distress.  HENT:  Head: Normocephalic and atraumatic.  Right Ear: External ear normal.  Left Ear: External ear normal.  Nose: Nose normal.  Mouth/Throat: Oropharynx is clear and moist.  Eyes: Conjunctivae and EOM are normal.  Neck: Normal range of motion.  Cardiovascular: Normal rate, regular rhythm, normal heart sounds and intact distal pulses.   No murmur heard. Respiratory: Effort normal and breath sounds normal. No respiratory distress. He has no wheezes.  GI: Soft. He exhibits no distension.  Musculoskeletal:       Right knee: Normal.       Left knee: Normal.       Right ankle: Normal.       Left ankle: Achilles tendon exhibits pain, defect and abnormal Thompson's test results.  Neurological: He is alert and oriented to person, place, and time. He has normal strength. No sensory deficit.  Skin: No rash noted. He is not diaphoretic. No erythema.  Psychiatric: He has a normal mood and affect. His behavior is normal.     Assessment/Plan Left Achilles tendon rupture with retraction He needs an open left Achilles tendon repair with graft and possibly anchors. Risks and benefits of the procedure discussed with the patient by Dr. Darrelyn HillockGioffre.   H&P performed by Dr. Ranee Gosselinonald Gioffre, MD  Rangel Echeverri Leotis ShamesLAUREN 11/18/2014, 10:51 AM

## 2014-11-20 NOTE — Brief Op Note (Signed)
11/20/2014  2:01 PM  PATIENT:  Loraine LericheMark Pettis  59 y.o. male  PRE-OPERATIVE DIAGNOSIS:  left achilles tendon rupture  POST-OPERATIVE DIAGNOSIS:  left achilles tendon rupture  PROCEDURE:  Procedure(s): OPEN REPAIR OF A LEFT ACHILLES TENDON RUPTURE (Left) withanchors and Graft  SURGEON:  Surgeon(s) and Role:    * Ranee Gosselinonald Lorrain Rivers, MD - Primary  PHYSICIAN ASSISTANT:Amber Day Heightsonstable PA   ASSISTANTS: Dimitri PedAmber Constable PA  ANESTHESIA:   general  EBL:     BLOOD ADMINISTERED:none  DRAINS: none   LOCAL MEDICATIONS USED:  MARCAINE 8cc oc plain 0.50%     SPECIMEN:  No Specimen  DISPOSITION OF SPECIMEN:  N/A  COUNTS:  YES  TOURNIQUET:   Total Tourniquet Time Documented: Thigh (Left) - 61 minutes Total: Thigh (Left) - 61 minutes   DICTATION: .Other Dictation: Dictation Number [id number]  PLAN OF CARE: Admit for overnight observation  PATIENT DISPOSITION:  PACU - hemodynamically stable.   Delay start of Pharmacological VTE agent (>24hrs) due to surgical blood loss or risk of bleeding: yes

## 2014-11-20 NOTE — Progress Notes (Signed)
Inpatient Diabetes Program Recommendations  AACE/ADA: New Consensus Statement on Inpatient Glycemic Control (2013)  Target Ranges:  Prepandial:   less than 140 mg/dL      Peak postprandial:   less than 180 mg/dL (1-2 hours)      Critically ill patients:  140 - 180 mg/dL   Reason for Visit: Insulin Pump  59 year old male admitted with acute pain and dysfunction of his left ankle for repair of torn Achilles. Has insulin pump and restarted it at approx 1700. On CL diet with advancement to CHO mod med. Pt contract signed and instructed to record boluses on worksheet for RN to record in EMR.  Results for [name], Joshua Monroe (MRN [id number]) as of 11/20/2014 17:30  Ref. Range 11/19/2014 11:55  Glucose Latest Ref Range: 65-99 mg/dL 347165 (H)   Insulin pump basal - 2units/hour with total of 24 units QD CHO ratio - 1/5 Correction - 50  Also takes metformin 2000 mg QAM per MAR. Blood sugars WNL. Discussed above with RN.  Thank you. Ailene Ardshonda Elisse Pennick, RD, LDN, CDE Inpatient Diabetes Coordinator [phone]

## 2014-11-20 NOTE — Interval H&P Note (Signed)
History and Physical Interval Note:  11/20/2014 12:19 PM  Joshua Monroe  has presented today for surgery, with the diagnosis of left achilles tendon rupture  The various methods of treatment have been discussed with the patient and family. After consideration of risks, benefits and other options for treatment, the patient has consented to  Procedure(s): OPEN REPAIR OF A LEFT ACHILLES TENDON RUPTURE (Left) as a surgical intervention .  The patient's history has been reviewed, patient examined, no change in status, stable for surgery.  I have reviewed the patient's chart and labs.  Questions were answered to the patient's satisfaction.     Carroll Lingelbach A

## 2014-11-20 NOTE — Progress Notes (Signed)
Pt. In holding room.  CBG checked-blood sugar down to 90 mg/dl.  Dr. Leta JunglingEwell notified.  Insulin pump turned off by pt. And needle removed per pt. Due to prone positioning planned for surgery.  CBGS will be followed hourly.  Diabetes coordinator to follow up with pt. Postop.

## 2014-11-20 NOTE — Progress Notes (Signed)
Note patient has insulin pump which was turned off during surgery according to RN.  He currently has order for Novolog moderate correction.  He will need "Insulin Pump" order set while in the hospital once Alert, oriented and able to manage insulin pump. Once insulin pump restarted, he will no longer need Novolog correction order. Called and discussed with RN and asked her to call MD to clarify orders.  Thanks, Beryl MeagerJenny Desiray Orchard, RN, BC-ADM Inpatient Diabetes Coordinator Pager [phone] (8a-5p)

## 2014-11-20 NOTE — Progress Notes (Signed)
Patient's endocrinologist is Dr. [place]at[place]odotou [place]

## 2014-11-20 NOTE — Transfer of Care (Signed)
Immediate Anesthesia Transfer of Care Note  Patient: Joshua Monroe  Procedure(s) Performed: Procedure(s): OPEN REPAIR OF A LEFT ACHILLES TENDON RUPTURE (Left)  Patient Location: PACU  Anesthesia Type:General  Level of Consciousness:  sedated, patient cooperative and responds to stimulation  Airway & Oxygen Therapy:Patient Spontanous Breathing and Patient connected to face mask oxgen  Post-op Assessment:  Report given to PACU RN and Post -op Vital signs reviewed and stable  Post vital signs:  Reviewed and stable  Last Vitals:  Filed Vitals:   11/20/14 1035  BP: 154/80  Pulse: 62  Temp: 36.3 C  Resp: 16    Complications: No apparent anesthesia complications

## 2014-11-21 ENCOUNTER — Encounter (HOSPITAL_COMMUNITY): Payer: Self-pay | Admitting: Orthopedic Surgery

## 2014-11-21 DIAGNOSIS — S86012A Strain of left Achilles tendon, initial encounter: Secondary | ICD-10-CM | POA: Diagnosis not present

## 2014-11-21 MED ORDER — ENOXAPARIN SODIUM 40 MG/0.4ML ~~LOC~~ SOLN: 40.0000 mg | SUBCUTANEOUS | Status: DC

## 2014-11-21 MED ORDER — IBUPROFEN 800 MG PO TABS
800.0000 mg | ORAL_TABLET | Freq: Four times a day (QID) | ORAL | Status: DC | PRN
  Administered 2014-11-21: 800 mg via ORAL
  Filled 2014-11-21: qty 1

## 2014-11-21 MED ORDER — OXYCODONE-ACETAMINOPHEN 5-325 MG PO TABS: 1.0000 | ORAL_TABLET | ORAL | Status: DC | PRN

## 2014-11-21 MED ORDER — METHOCARBAMOL 500 MG PO TABS: 500.0000 mg | ORAL_TABLET | Freq: Four times a day (QID) | ORAL | Status: DC | PRN

## 2014-11-21 MED ORDER — IBUPROFEN 800 MG PO TABS: 800.0000 mg | ORAL_TABLET | Freq: Four times a day (QID) | ORAL | Status: DC | PRN

## 2014-11-21 NOTE — Care Management Note (Signed)
Case Management Note  Patient Details  Name: Joshua Monroe MRN: [id number] Date of Birth: 11/16/1955  Subjective/Objective:                 59 yo admitted with achilles tendon injury   Action/Plan: Home  Expected Discharge Date:                  Expected Discharge Plan:  Home/Self Care  In-House Referral:     Discharge planning Services  CM Consult  Post Acute Care Choice:    Choice offered to:     DME Arranged:  [facility] DME Agency:  [facility]  [facility]     Status of Service:  In process, will continue to follow  Medicare Important Message Given:    Date Medicare IM Given:    Medicare IM give by:    Date Additional Medicare IM Given:    Additional Medicare Important Message give by:     If discussed at Long Length of Stay Meetings, dates discussed:    Additional Comments: MD order for RW. [facility] given referral. No other DC needs noted.  Bartholome BillCLEMENTS, Arkeem Harts H, RN 11/21/2014, 11:25 AM

## 2014-11-21 NOTE — Evaluation (Signed)
Physical Therapy Evaluation Patient Details Name: Joshua Monroe MRN: [id number] DOB: 1955-07-10 Today's Date: 11/21/2014   History of Present Illness  L achillies repair  Clinical Impression  Pt s/p L achilles tendon repair presents with post op pain and NWB status limiting functional mobility.  Pt plans dc home with spouse and is currently mobilizing with RW or knee scooter at min guard/ Sup level with good safety awareness and compliance with NWB.      Follow Up Recommendations No PT follow up    Equipment Recommendations  Rolling walker with 5" wheels    Recommendations for Other Services       Precautions / Restrictions Precautions Precautions: Fall Restrictions Weight Bearing Restrictions: Yes LLE Weight Bearing: Non weight bearing      Mobility  Bed Mobility Overal bed mobility: Modified Independent                Transfers Overall transfer level: Needs assistance Equipment used: Rolling walker (2 wheeled) Transfers: Sit to/from Stand Sit to Stand: Min guard;Supervision         General transfer comment: cues for LE management and use of UEs to self assist  Ambulation/Gait Ambulation/Gait assistance: Min guard Ambulation Distance (Feet): 65 Feet Assistive device: Rolling walker (2 wheeled) Gait Pattern/deviations: Step-to pattern;Decreased step length - right;Decreased step length - left;Shuffle     General Gait Details: min cues for posture, position from RW and L LE management; Pt also propelled knee scooter 200' with min guard assist and min cues for saftey  Stairs Stairs: Yes Stairs assistance: Min assist Stair Management: No rails;Step to pattern;Backwards;With walker Number of Stairs: 1 (twice) General stair comments: cues for sequence and foot/RW placement  Wheelchair Mobility    Modified Rankin (Stroke Patients Only)       Balance                                             Pertinent Vitals/Pain Pain  Assessment: 0-10 Pain Score: 7  Pain Location: L ankle Pain Descriptors / Indicators: Aching;Sore Pain Intervention(s): Limited activity within patient's tolerance;Monitored during session;Premedicated before session    Home Living Family/patient expects to be discharged to:: Private residence Living Arrangements: Spouse/significant other Available Help at Discharge: Family Type of Home: House Home Access: Stairs to enter Entrance Stairs-Rails: None Secretary/administrator of Steps: 1 Home Layout: One level Home Equipment: None      Prior Function Level of Independence: Independent               Hand Dominance        Extremity/Trunk Assessment   Upper Extremity Assessment: Overall WFL for tasks assessed           Lower Extremity Assessment: LLE deficits/detail   LLE Deficits / Details: ankle/foot in cast  Cervical / Trunk Assessment: Normal  Communication   Communication: No difficulties  Cognition Arousal/Alertness: Awake/alert Behavior During Therapy: WFL for tasks assessed/performed Overall Cognitive Status: Within Functional Limits for tasks assessed                      General Comments      Exercises        Assessment/Plan    PT Assessment Patient needs continued PT services  PT Diagnosis Difficulty walking   PT Problem List Decreased strength;Decreased range of motion;Decreased activity tolerance;Decreased mobility;Decreased knowledge of use  of DME;Pain  PT Treatment Interventions DME instruction;Gait training;Stair training;Functional mobility training;Therapeutic activities;Therapeutic exercise;Patient/family education   PT Goals (Current goals can be found in the Care Plan section) Acute Rehab PT Goals Patient Stated Goal: Preach at church this Sunday PT Goal Formulation: With patient Time For Goal Achievement: 11/29/14 Potential to Achieve Goals: Good    Frequency 7X/week   Barriers to discharge        Co-evaluation                End of Session Equipment Utilized During Treatment: Gait belt Activity Tolerance: Patient tolerated treatment well Patient left: in bed;with call bell/phone within reach;with family/visitor present Nurse Communication: Mobility status         Time: [id number] PT Time Calculation (min) (ACUTE ONLY): 35 min   Charges:         PT G Codes:        Yulia Ulrich 11/21/2014, 1:02 PM

## 2014-11-21 NOTE — Progress Notes (Signed)
Utilization review completed.  

## 2014-11-21 NOTE — Progress Notes (Signed)
   Subjective: 1 Day Post-Op Procedure(s) (LRB): OPEN REPAIR OF A LEFT ACHILLES TENDON RUPTURE (Left) Patient reports pain as moderate.   Patient seen in rounds for Dr. Darrelyn HillockGioffre. Patient is well, and has had no acute complaints or problems other than discomfort in the left ankle. Voiding well. No issues overnight.    Objective: Vital signs in last 24 hours: Temp:  [97.3 F (36.3 C)-98.7 F (37.1 C)] 97.9 F (36.6 C) (07/01 0559) Pulse Rate:  [55-66] 64 (07/01 0559) Resp:  [11-16] 16 (07/01 0559) BP: (132-163)/(62-83) 146/62 mmHg (07/01 0559) SpO2:  [97 %-100 %] 97 % (07/01 0559) Weight:  [96.163 kg (212 lb)] 96.163 kg (212 lb) (06/30 1101)  Intake/Output from previous day:  Intake/Output Summary (Last 24 hours) at 11/21/14 0719 Last data filed at 11/21/14 0711  Gross per 24 hour  Intake 2211.67 ml  Output   2325 ml  Net -113.33 ml    Intake/Output this shift: Total I/O In: -  Out: 500 [Urine:500]  Labs:  Recent Labs  11/19/14 1155 11/20/14 1600  HGB 11.6* 10.5*    Recent Labs  11/19/14 1155 11/20/14 1600  WBC 12.3* 7.4  RBC 4.43 4.01*  HCT 36.5* 32.8*  PLT 233 187    Recent Labs  11/19/14 1155 11/20/14 1600  NA 139  --   K 4.6  --   CL 105  --   CO2 24  --   BUN 26*  --   CREATININE 1.02 1.01  GLUCOSE 165*  --   CALCIUM 9.7  --     Recent Labs  11/19/14 1155  INR 1.03    EXAM General - Patient is Alert and Oriented Dressing/Incision - cast in excellent condition Motor Function - intact, toes well on exam.   Past Medical History  Diagnosis Date  . Diabetes mellitus without complication   . Hypertension   . GERD (gastroesophageal reflux disease)   . Arthritis     Assessment/Plan: 1 Day Post-Op Procedure(s) (LRB): OPEN REPAIR OF A LEFT ACHILLES TENDON RUPTURE (Left) Active Problems:   Achilles tendon injury  Estimated body mass index is 27.98 kg/(m^2) as calculated from the following:   Height as of this encounter: 6\' 1"  (1.854  m).   Weight as of this encounter: 96.163 kg (212 lb). Advance diet Up with therapy D/C IV fluids Discharge home   DVT Prophylaxis - Lovenox Nonweightbearing left LE  He is doing great this morning. Will plan for DC home today.   Dimitri PedAmber Azaria Bartell, PA-C Orthopaedic Surgery 11/21/2014, 7:19 AM

## 2014-11-21 NOTE — Discharge Instructions (Addendum)
Nonweightbearing left leg Keep cast dry Use Lovenox injections for one week in order to prevent blood clots. After completion of that week, start on aspirin 325mg  twice daily.  Call if any temperatures greater than 101 or any wound complications: [phone] during the day and ask for Dr. Jeannetta EllisGioffre's nurse, Mackey Birchwoodammy Johnson.

## 2014-11-21 NOTE — Op Note (Signed)
NAMMagda Kiel:  Swinson, Lynkin             ACCOUNT NO.:  [id number]  MEDICAL RECORD NO.:  [id number]  LOCATION:  1604                         [facility]  PHYSICIAN:  Georges Lynchonald A. Keshara Kiger, M.D.DATE OF BIRTH:  08/01/1955  DATE OF PROCEDURE:  11/20/2014 DATE OF DISCHARGE:                              OPERATIVE REPORT   SURGEON:  Georges Lynchonald A. Darrelyn HillockGioffre, M.D.  OPERATIVE ASSISTANT:  Dimitri PedAmber Constable, PA  PREOPERATIVE DIAGNOSIS:  Complete rupture of the left Achilles tendon.  POSTOPERATIVE DIAGNOSIS:  Complete rupture of the left Achilles tendon.  OPERATION: 1. Open repair of a left Achilles tendon utilizing two swivel locking-     type PushLock anchors. 2. We also used a DermaSpan graft. 3. He was placed in plantar flexion and a short leg cast.  DESCRIPTION OF PROCEDURE:  Under general anesthesia, routine orthopedic prep and draping, the left lower extremity was carried out.  We first went through the appropriate time-out, also marked the appropriate left leg in the holding area.  At this time, after the time-out and prep, the tourniquet was elevated at 300 mmHg.  With the foot now in plantar flexion, curvilinear incision was made over the medial aspect of the left Achilles tendon.  Great care was taken not to injure the underlying sensory nerves.  At this time, I exposed the Achilles tendon, it was completely ruptured with a large bone fragment of the calcaneus, now was gone to remove that bone fragment for the tendon, but it was too large and we want to destroy most of the tendon.  So what we did, we put a drill hole through the bone fragment and then brought our sutures up in the usual fashion through the tendon and brought the tendon back down distally, reattached the remaining tendon that was present and sutured it in place.  While the tension was held, we then utilized the two SureLock-type anchors with swivels and inserted those into the calcaneus several millimeters apart and at an  angle.  At this time, the remaining suture then was brought down into the anchor and we tensed up the repair site that way.  Following that, we then reinforced the tendon rupture with DermaSpan graft material.  The wound then was closed in layers in usual fashion with 3-0 nylon suture.  We placed the Neosporin dressing over the patient and then a well-padded short leg cast was placed on the leg with the foot in plantar flexion.  Note, we had an excellent reduction of the tendon, I could dorsiflex and plantar flex that tendon prior to closing and we had good repair.  We also used about 8 mL of plain 0.5% Marcaine into this suture site before we applied Neosporin dressing.  Postop, will be placed on Lovenox.  He had 2 g of IV Ancef preop.          ______________________________ Georges Lynchonald A. Darrelyn HillockGioffre, M.D.     RAG/MEDQ  D:  11/20/2014  T:  11/21/2014  Job:  [id number]

## 2014-11-21 NOTE — Progress Notes (Signed)
   11/21/14 1043  PT Time Calculation  PT Start Time (ACUTE ONLY) 1043  PT Stop Time (ACUTE ONLY) 1118  PT Time Calculation (min) (ACUTE ONLY) 35 min  PT G-Codes **NOT FOR INPATIENT CLASS**  Functional Assessment Tool Used clinical judgement  Functional Limitation Mobility: Walking and moving around  Mobility: Walking and Moving Around Current Status (H8469(G8978) CJ  Mobility: Walking and Moving Around Goal Status (G2952(G8979) CJ  Mobility: Walking and Moving Around Discharge Status (W4132(G8980) CJ  PT General Charges  $$ ACUTE PT VISIT 1 Procedure  PT Evaluation  $Initial PT Evaluation Tier I 1 Procedure  PT Treatments  $Gait Training 8-22 mins

## 2014-11-25 NOTE — Discharge Summary (Signed)
Physician Discharge Summary   Patient ID: Joshua Monroe MRN: [id number] DOB/AGE: 11/18/1955 59 y.o.  Admit date: 11/20/2014 Discharge date: 11/21/2014  Primary Diagnosis: Achilles tendon rupture, left ankle   Admission Diagnoses:  Past Medical History  Diagnosis Date  . Diabetes mellitus without complication   . Hypertension   . GERD (gastroesophageal reflux disease)   . Arthritis    Discharge Diagnoses:   Active Problems:   Achilles tendon injury  Estimated body mass index is 27.98 kg/(m^2) as calculated from the following:   Height as of this encounter: _0  (1.854 m).   Weight as of this encounter: 96.163 kg (212 lb).  Procedure:  Procedure(s) (LRB): OPEN REPAIR OF A LEFT ACHILLES TENDON RUPTURE (Left)   Consults: None  HPI: The patient presented on 11/14/2014 with acute pain and dysfunction of his left ankle following an injury while playing tennis. Symptoms are reported to be located in the left posterior ankle and include ankle pain, decreased range of motion (limited dorsiflexion) and audible pop at the time of injury. The patient reports that symptoms radiate to the left lower leg (posterior). The patient describes their symptoms as mild (varies) and does feel that they are unchanged. Symptoms are exacerbated by weight bearing and walking (especially dorsiflexion). MRI showed a complete rupture of the Achilles tendon of the left ankle with retraction.   Laboratory Data: Admission on 11/20/2014, Discharged on 11/21/2014  Component Date Value Ref Range Status  . Glucose-Capillary 11/20/2014 172* 65 - 99 mg/dL Final  . Glucose-Capillary 11/20/2014 92  65 - 99 mg/dL Final  . Comment 1 11/20/2014 Document in Chart   Final  . Comment 2 11/20/2014 Call MD NNP PA CNM   Final  . Glucose-Capillary 11/20/2014 78  65 - 99 mg/dL Final  . Glucose-Capillary 11/20/2014 88  65 - 99 mg/dL Final  . Glucose-Capillary 11/20/2014 99  65 - 99 mg/dL Final  . Comment 1 11/20/2014 Document in  Chart   Final  . WBC 11/20/2014 7.4  4.0 - 10.5 K/uL Final  . RBC 11/20/2014 4.01* 4.22 - 5.81 MIL/uL Final  . Hemoglobin 11/20/2014 10.5* 13.0 - 17.0 g/dL Final  . HCT 11/20/2014 32.8* 39.0 - 52.0 % Final  . MCV 11/20/2014 81.8  78.0 - 100.0 fL Final  . MCH 11/20/2014 26.2  26.0 - 34.0 pg Final  . MCHC 11/20/2014 32.0  30.0 - 36.0 g/dL Final  . RDW 11/20/2014 14.0  11.5 - 15.5 % Final  . Platelets 11/20/2014 187  150 - 400 K/uL Final  . Creatinine, Ser 11/20/2014 1.01  0.61 - 1.24 mg/dL Final  . GFR calc non Af Amer 11/20/2014 >60  >60 mL/min Final  . GFR calc Af Amer 11/20/2014 >60  >60 mL/min Final   Comment: (NOTE) The eGFR has been calculated using the CKD EPI equation. This calculation has not been validated in all clinical situations. eGFR's persistently <60 mL/min signify possible Chronic Kidney Disease.   . Glucose-Capillary 11/20/2014 115* 65 - 99 mg/dL Final  Hospital Outpatient Visit on 11/19/2014  Component Date Value Ref Range Status  . WBC 11/19/2014 12.3* 4.0 - 10.5 K/uL Final  . RBC 11/19/2014 4.43  4.22 - 5.81 MIL/uL Final  . Hemoglobin 11/19/2014 11.6* 13.0 - 17.0 g/dL Final  . HCT 11/19/2014 36.5* 39.0 - 52.0 % Final  . MCV 11/19/2014 82.4  78.0 - 100.0 fL Final  . MCH 11/19/2014 26.2  26.0 - 34.0 pg Final  . MCHC 11/19/2014 31.8  30.0 -  36.0 g/dL Final  . RDW 11/19/2014 14.1  11.5 - 15.5 % Final  . Platelets 11/19/2014 233  150 - 400 K/uL Final  . Neutrophils Relative % 11/19/2014 82* 43 - 77 % Final  . Neutro Abs 11/19/2014 10.1* 1.7 - 7.7 K/uL Final  . Lymphocytes Relative 11/19/2014 10* 12 - 46 % Final  . Lymphs Abs 11/19/2014 1.2  0.7 - 4.0 K/uL Final  . Monocytes Relative 11/19/2014 7  3 - 12 % Final  . Monocytes Absolute 11/19/2014 0.9  0.1 - 1.0 K/uL Final  . Eosinophils Relative 11/19/2014 1  0 - 5 % Final  . Eosinophils Absolute 11/19/2014 0.1  0.0 - 0.7 K/uL Final  . Basophils Relative 11/19/2014 0  0 - 1 % Final  . Basophils Absolute 11/19/2014  0.0  0.0 - 0.1 K/uL Final  . Sodium 11/19/2014 139  135 - 145 mmol/L Final  . Potassium 11/19/2014 4.6  3.5 - 5.1 mmol/L Final  . Chloride 11/19/2014 105  101 - 111 mmol/L Final  . CO2 11/19/2014 24  22 - 32 mmol/L Final  . Glucose, Bld 11/19/2014 165* 65 - 99 mg/dL Final  . BUN 11/19/2014 26* 6 - 20 mg/dL Final  . Creatinine, Ser 11/19/2014 1.02  0.61 - 1.24 mg/dL Final  . Calcium 11/19/2014 9.7  8.9 - 10.3 mg/dL Final  . Total Protein 11/19/2014 7.2  6.5 - 8.1 g/dL Final  . Albumin 11/19/2014 4.2  3.5 - 5.0 g/dL Final  . AST 11/19/2014 20  15 - 41 U/L Final  . ALT 11/19/2014 15* 17 - 63 U/L Final  . Alkaline Phosphatase 11/19/2014 87  38 - 126 U/L Final  . Total Bilirubin 11/19/2014 0.6  0.3 - 1.2 mg/dL Final  . GFR calc non Af Amer 11/19/2014 >60  >60 mL/min Final  . GFR calc Af Amer 11/19/2014 >60  >60 mL/min Final   Comment: (NOTE) The eGFR has been calculated using the CKD EPI equation. This calculation has not been validated in all clinical situations. eGFR's persistently <60 mL/min signify possible Chronic Kidney Disease.   . Anion gap 11/19/2014 10  5 - 15 Final  . Prothrombin Time 11/19/2014 13.7  11.6 - 15.2 seconds Final  . INR 11/19/2014 1.03  0.00 - 1.49 Final      Hospital Course: Joshua Monroe is a 59 y.o. who was admitted to [facility]. They were brought to the operating room on 11/20/2014 and underwent Procedure(s): OPEN REPAIR OF A LEFT ACHILLES TENDON RUPTURE.  Patient tolerated the procedure well and was later transferred to the recovery room and then to the orthopaedic floor for postoperative care.  They were given PO and IV analgesics for pain control following their surgery.  They were given 24 hours of postoperative antibiotics of  Anti-infectives    Start     Dose/Rate Route Frequency Ordered Stop   11/20/14 2000  ceFAZolin (ANCEF) IVPB 1 g/50 mL premix     1 g 100 mL/hr over 30 Minutes Intravenous Every 6 hours 11/20/14 1521 11/21/14 0801    11/20/14 1316  polymyxin B 500,000 Units, bacitracin 50,000 Units in sodium chloride irrigation 0.9 % 500 mL irrigation  Status:  Discontinued       As needed 11/20/14 1316 11/20/14 1400   11/20/14 1033  ceFAZolin (ANCEF) IVPB 2 g/50 mL premix     2 g 100 mL/hr over 30 Minutes Intravenous On call to O.R. 11/20/14 1033 11/20/14 1245     and  started on DVT prophylaxis in the form of Lovenox.   Discharge planning consulted to help with postop disposition and equipment needs.  Patient had a fair night on the evening of surgery.  They started to get up OOB with therapy on day one.  Patient was seen in rounds and was ready to go home.   Diet: Diabetic diet Activity:NWB left LE Follow-up:in 1 week Disposition - Home Discharged Condition: stable   Discharge Instructions    Call MD / Call 911    Complete by:  As directed   If you experience chest pain or shortness of breath, CALL 911 and be transported to the hospital emergency room.  If you develope a fever above 101 F, pus (white drainage) or increased drainage or redness at the wound, or calf pain, call your surgeon's office.     Constipation Prevention    Complete by:  As directed   Drink plenty of fluids.  Prune juice may be helpful.  You may use a stool softener, such as Colace (over the counter) 100 mg twice a day.  Use MiraLax (over the counter) for constipation as needed.     Diet Carb Modified    Complete by:  As directed      Discharge instructions    Complete by:  As directed   Nonweightbearing left leg Keep cast dry Call if any temperatures greater than 101 or any wound complications: [phone] during the day and ask for Dr. Charlestine Night nurse, Brunilda Payor.     Increase activity slowly as tolerated    Complete by:  As directed             Medication List    TAKE these medications        atorvastatin 40 MG tablet  Commonly known as:  LIPITOR  Take 1 tablet by mouth every evening.     enoxaparin 40 MG/0.4ML injection    Commonly known as:  LOVENOX  Inject 0.4 mLs (40 mg total) into the skin daily.     ibuprofen 800 MG tablet  Commonly known as:  ADVIL,MOTRIN  Take 1 tablet (800 mg total) by mouth every 6 (six) hours as needed for mild pain or moderate pain.     insulin pump Soln  Inject into the skin. Humalog     lipase/protease/amylase 36000 UNITS Cpep capsule  Commonly known as:  CREON  Take 108,000 Units by mouth 3 (three) times daily as needed (with meal high in fat).     metFORMIN 1000 MG tablet  Commonly known as:  GLUCOPHAGE  Take 2 tablets by mouth every morning.     methocarbamol 500 MG tablet  Commonly known as:  ROBAXIN  Take 1 tablet (500 mg total) by mouth every 6 (six) hours as needed for muscle spasms.     metoprolol succinate 50 MG 24 hr tablet  Commonly known as:  TOPROL-XL  Take 50 mg by mouth every morning. Take with or immediately following a meal.     oxyCODONE-acetaminophen 5-325 MG per tablet  Commonly known as:  PERCOCET/ROXICET  Take 1-2 tablets by mouth every 4 (four) hours as needed for severe pain (Q4-6 hours PRN).     RABEprazole 20 MG tablet  Commonly known as:  ACIPHEX  Take 20 mg by mouth every morning.     TURMERIC PO  Take 1,050 mg by mouth every morning.           Follow-up Information    Follow up with GIOFFRE,RONALD  A, MD. Schedule an appointment as soon as possible for a visit in 10 days.   Specialty:  Orthopedic Surgery   Contact information:   [address] [place] [address] [phone]       Signed: Ardeen Jourdain, PA-C Orthopaedic Surgery 11/25/2014, 1:28 PM

## 2014-12-15 DIAGNOSIS — K859 Acute pancreatitis without necrosis or infection, unspecified: Secondary | ICD-10-CM | POA: Insufficient documentation

## 2014-12-15 HISTORY — DX: Acute pancreatitis without necrosis or infection, unspecified: K85.90

## 2015-11-30 DIAGNOSIS — R5382 Chronic fatigue, unspecified: Secondary | ICD-10-CM

## 2015-11-30 HISTORY — DX: Chronic fatigue, unspecified: R53.82

## 2016-04-19 ENCOUNTER — Other Ambulatory Visit: Payer: Self-pay | Admitting: Orthopedic Surgery

## 2016-04-19 DIAGNOSIS — M545 Low back pain, unspecified: Secondary | ICD-10-CM

## 2016-04-28 ENCOUNTER — Ambulatory Visit
Admission: RE | Admit: 2016-04-28 | Discharge: 2016-04-28 | Disposition: A | Discharge status: Home / Self Care | Payer: BLUE CROSS/BLUE SHIELD | Source: Ambulatory Visit | Attending: Orthopedic Surgery | Admitting: Orthopedic Surgery

## 2016-04-28 DIAGNOSIS — M545 Low back pain, unspecified: Secondary | ICD-10-CM

## 2017-04-19 NOTE — Progress Notes (Deleted)
Cardiology Office Note   Date:  04/19/2017   ID:  Joshua Monroe, DOB 09/15/1955, MRN [id number]  PCP:  Joshua Monroe on file.  Endocrine:  Joshua Monroe   Cardiologist:   Joshua HawsPeter Maxmillian Carsey, MD   Joshua chief complaint on file.     History of Present Illness: Joshua KielMark Monroe is a 61 y.o. male who presents for consultation regarding Syncope. Referred by Dr Joshua Monroe Type 1 DM since age 61. Insulin pump. PPT by pancreatitis. Previous bariatric surgery. 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 CRF;s also include HLD and HTN. Intolerant to crestor. Non Smoker Joshua ETOH. 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 BS was 220. Pulse and BP normal during office  Visit A1c has been high 8.2 LDL is 70 TC 117  ECG reviewed from 04/17/17 NSR rate 62 isolated PAC otherwise normal QT 404    Past Medical History:  Diagnosis Date  . Arthritis   . Diabetes mellitus without complication   . GERD (gastroesophageal reflux disease)   . Hypertension     Past Surgical History:  Procedure Laterality Date  . ACHILLES TENDON SURGERY Left 11/20/2014   Procedure: OPEN REPAIR OF A LEFT ACHILLES TENDON RUPTURE;  Surgeon: Joshua Gosselinonald Gioffre, MD;  Location: [facility];  Service: Orthopedics;  Laterality: Left;  . APPENDECTOMY    . CHOLECYSTECTOMY    . TONSILLECTOMY       Current Outpatient Medications  Medication Sig Dispense Refill  . atorvastatin (LIPITOR) 40 MG tablet Take 1 tablet by mouth every evening.  5  . enoxaparin (LOVENOX) 40 MG/0.4ML injection Inject 0.4 mLs (40 mg total) into the skin daily. 7 Syringe 0  . ibuprofen (ADVIL,MOTRIN) 800 MG tablet Take 1 tablet (800 mg total) by mouth every 6 (six) hours as needed for mild pain or moderate pain. 60 tablet 0  . Insulin Human (INSULIN PUMP) SOLN Inject into the skin. Humalog    . lipase/protease/amylase (CREON) 36000 UNITS CPEP capsule Take 108,000 Units by mouth 3 (three) times daily as needed (with meal high in fat).    . metFORMIN  (GLUCOPHAGE) 1000 MG tablet Take 2 tablets by mouth every morning.    . methocarbamol (ROBAXIN) 500 MG tablet Take 1 tablet (500 mg total) by mouth every 6 (six) hours as needed for muscle spasms. 40 tablet 1  . metoprolol succinate (TOPROL-XL) 50 MG 24 hr tablet Take 50 mg by mouth every morning. Take with or immediately following a meal.    . oxyCODONE-acetaminophen (PERCOCET/ROXICET) 5-325 MG per tablet Take 1-2 tablets by mouth every 4 (four) hours as needed for severe pain (Q4-6 hours PRN). 80 tablet 0  . RABEprazole (ACIPHEX) 20 MG tablet Take 20 mg by mouth every morning.    . TURMERIC PO Take 1,050 mg by mouth every morning.      Joshua current facility-administered medications for this visit.     Allergies:   Other    Social History:  The patient  reports that  has never smoked. he has never used smokeless tobacco. He reports that he does not drink alcohol or use drugs.   Family History:  The patient's family history is not on file.    ROS:  Please see the history of present illness.   Otherwise, review of systems are positive for none.   All other systems are reviewed and negative.    PHYSICAL EXAM: VS:  There were Joshua vitals taken for this visit. , BMI There is Joshua height or weight on file  to calculate BMI. Affect appropriate Healthy:  appears stated age HEENT: normal Neck supple with Joshua adenopathy JVP normal Joshua bruits Joshua thyromegaly Lungs clear with Joshua wheezing and good diaphragmatic motion Heart:  S1/S2 Joshua murmur, Joshua rub, gallop or click PMI normal Abdomen: benighn, BS positve, Joshua tenderness, Joshua AAA Joshua bruit.  Joshua HSM or HJR Distal pulses intact with Joshua bruits Joshua edema Neuro non-focal Skin warm and dry Joshua muscular weakness    EKG:  2016 NSR normal ECG see update in HPI for 04/17/17   Recent Labs: Joshua results found for requested labs within last 8760 hours.    Lipid Panel Joshua results found for: CHOL, TRIG, HDL, CHOLHDL, VLDL, LDLCALC, LDLDIRECT    Wt Readings  from Last 3 Encounters:  11/20/14 212 lb (96.2 kg)  11/19/14 212 lb 9.6 oz (96.4 kg)  09/14/14 208 lb (94.3 kg)      Other studies Reviewed: Additional studies/ records that were reviewed today include: Notes Dr Joshua Monroe and [facility] ECG 2016 .    ASSESSMENT AND PLAN:  1.  Syncope 2. IDDM 3. HTN 4. HLD 5. Bariatric Surgery   Current medicines are reviewed at length with the patient today.  The patient does not have concerns regarding medicines.  The following changes have been made:  Joshua change  Labs/ tests ordered today include: Echo, Ex Myovue 48 hour holter  Joshua orders of the defined types were placed in this encounter.    Disposition:   FU with cardiology in a year      Signed, Joshua HawsPeter Aqsa Sensabaugh, MD  04/19/2017 1:57 PM    [facility] [address], [place], [place]  [address] Phone: [phone]; Fax: [phone]

## 2017-04-24 ENCOUNTER — Ambulatory Visit: Payer: BLUE CROSS/BLUE SHIELD | Admitting: Cardiovascular Disease

## 2017-04-24 DIAGNOSIS — I10 Essential (primary) hypertension: Secondary | ICD-10-CM

## 2017-04-24 DIAGNOSIS — R55 Syncope and collapse: Secondary | ICD-10-CM | POA: Insufficient documentation

## 2017-04-24 DIAGNOSIS — E089 Diabetes mellitus due to underlying condition without complications: Secondary | ICD-10-CM

## 2017-04-24 HISTORY — DX: Diabetes mellitus due to underlying condition without complications: E08.9

## 2017-04-24 HISTORY — DX: Syncope and collapse: R55

## 2017-04-24 HISTORY — DX: Essential (primary) hypertension: I10

## 2017-04-24 NOTE — Progress Notes (Signed)
Cardiology Office Note:    Date:  04/25/2017   ID:  Joshua Monroe, DOB 12/25/1955, MRN [id number]  PCP:  Dr Sharl MaKerr  Cardiologist:  Norman HerrlichBrian Evens Meno, MD   Referring MD: Talmage CoinKerr, Jeffrey, MD  ASSESSMENT:    1. Syncope, unspecified syncope type   2. Diabetes mellitus due to underlying condition without complication, with long-term current use of insulin (HCC)   3. Essential hypertension    PLAN:    In order of problems listed above:  1. Concerning episode of loss of consciousness.  This clearly could be hypoglycemic especially with insulin pump missing a meal Metformin to block gluconeogenesis and beta-blocker to blunt warning symptoms of hypoglycemia.  It is uncommon though that he recovered so quickly and his blood sugar went was checked after glucose was elevated.  He is at risk for heart disease and have asked him to undergo further evaluation including a 30-day event monitor for arrhythmia particularly bradycardia with beta-blocker and myocardial perfusion study pharmacologically with his knee and back injury to exclude CAD.  I have asked him to check blood pressure at home sitting and standing daily for 1 month. 2. Stable managed by his endocrinologist 3. Poorly controlled repeat blood pressure 160/80, I reduced his beta-blocker dose by 50% in view of the episodes start him on ARB and long-term I would like to see him off of beta-blocker this does appear to be hypoglycemic in etiology.  Next appointment 6 weeks   Medication Adjustments/Labs and Tests Ordered: Current medicines are reviewed at length with the patient today.  Concerns regarding medicines are outlined above.  Orders Placed This Encounter  Procedures  . Cardiac event monitor  . Myocardial Perfusion Imaging   Meds ordered this encounter  Medications  . losartan (COZAAR) 50 MG tablet    Sig: Take 1 tablet (50 mg total) by mouth daily.    Dispense:  90 tablet    Refill:  3  . metoprolol succinate (TOPROL-XL) 50 MG 24 hr  tablet    Sig: Take 1 tablet (50 mg total) by mouth every morning. Take with or immediately following a meal.    Dispense:  45 tablet    Refill:  3     Chief Complaint  Patient presents with  . Loss of Consciousness  6 weeks  History of Present Illness:    Joshua Monroe is a 61 y.o. 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  He is a type I diabetic and has an insulin pump takes a beta-blocker and Metformin.  About a year ago he had an episode of loss of consciousness due to hypoglycemia.  This episode at church occurred with missing his morning meal.  He had the feeling that he was not well he was flushed diaphoretic felt weak tried to grab onto the podium but was unable to and briefly lost consciousness he struck his back and he twisted his knee and is pending MR's.  He quickly recovered 15 minutes later after glucose his blood sugar was above 200.  People present described him as pale and apparently his pulse was normal as nurses were present.  He had no chest pain palpitation or syncope.  Seen by his PCP EKG shows sinus rhythm LVH voltage.  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  He has had no edema orthopnea or PND he has no known heart disease.  Past Medical History:  Diagnosis Date  . Achilles tendon injury 11/20/2014  . Arthritis   . Diabetes mellitus without complication (HCC)   . GERD (gastroesophageal reflux disease)   . Hypertension     Past Surgical History:  Procedure Laterality Date  . ACHILLES TENDON SURGERY Left 11/20/2014   Procedure: OPEN REPAIR OF A LEFT ACHILLES TENDON RUPTURE;  Surgeon: Ranee Gosselin, MD;  Location: [facility];  Service: Orthopedics;  Laterality: Left;  . APPENDECTOMY    . CHOLECYSTECTOMY    . TONSILLECTOMY      Current Medications: Current Meds  Medication Sig  . ibuprofen (ADVIL,MOTRIN) 800 MG tablet Take 1 tablet (800 mg total) by mouth every 6 (six) hours as needed for mild pain or moderate pain.  . Insulin Human (INSULIN PUMP) SOLN Inject into the skin. Humalog  . metFORMIN (GLUCOPHAGE) 1000 MG tablet Take 2 tablets by mouth every morning.  . metoprolol succinate (TOPROL-XL) 50 MG 24 hr tablet Take 1 tablet (50 mg total) by mouth every morning. Take with or immediately following a meal.  . RABEprazole (ACIPHEX) 20 MG tablet Take 20 mg by mouth every morning.  . TURMERIC PO Take 1,050 mg by mouth every morning.   . [DISCONTINUED] metoprolol succinate (TOPROL-XL) 50 MG 24 hr tablet Take 50 mg by mouth every morning. Take with or immediately following a meal.     Allergies:   Other and Simvastatin   Social History   Socioeconomic History  . Marital status: Married    Spouse name: None  . Number of children: None  . Years of education: None  . Highest education level: None  Social Needs  . Financial resource strain: None  . Food insecurity - worry: None  . Food insecurity - inability: None  . Transportation needs - medical: None  . Transportation needs - non-medical: None  Occupational History  . None  Tobacco Use  . Smoking status: Never Smoker  . Smokeless tobacco: Never Used  Substance and Sexual Activity  . Alcohol use: No  . Drug use: No  . Sexual activity: None  Other Topics Concern  . None  Social History Narrative  . None     Family History: The patient's family history includes Diabetes (age of onset: 61) in his mother; Healthy in his son.  ROS:   Review of Systems  Constitution: Negative.  HENT: Negative.   Eyes: Negative.   Cardiovascular: Positive for dyspnea on exertion (with exercise and walk uphill).  Respiratory: Positive for shortness of  breath.   Skin: Negative.   Musculoskeletal: Positive for back pain and joint pain.  Gastrointestinal: Negative.   Genitourinary: Negative.   Neurological: Negative.   Psychiatric/Behavioral: Negative.    Please see the history of present illness.     All other systems reviewed and are negative.  EKGs/Labs/Other Studies Reviewed:    The following studies were reviewed today:  EKG 04/17/17 reported as SRTH , normal except occasional APC's EKG:  Independently reviewed, LVH voltage, SRTH 1 APC  Recent Labs: A1C 7.6%, CMP normal No results found for requested labs within last 8760 hours.  Recent Lipid Panel Chol 117, HDL 34 LDL 70 No results found for: CHOL, TRIG, HDL, CHOLHDL, VLDL, LDLCALC, LDLDIRECT  Physical Exam:    VS:  BP (!) 166/62   Pulse 68   Resp 10   Ht 6\' 1"  (1.854 m)   Wt 204 lb 12.8 oz (92.9 kg)   BMI 27.02 kg/m     Wt Readings from Last 3 Encounters:  04/25/17 204 lb 12.8 oz (92.9 kg)  11/20/14 212 lb (96.2 kg)  11/19/14 212 lb 9.6 oz (96.4 kg)     GEN:  Well nourished, well developed in no acute distress HEENT: Normal NECK: No JVD; No carotid bruits LYMPHATICS: No lymphadenopathy CARDIAC: RRR, no murmurs, rubs, gallops RESPIRATORY:  Clear to auscultation without rales, wheezing or rhonchi  ABDOMEN: Soft, non-tender, non-distended MUSCULOSKELETAL:  No edema; No deformity  SKIN: Warm and dry NEUROLOGIC:  Alert and oriented x 3 PSYCHIATRIC:  Normal affect     Signed, Norman HerrlichBrian Keysean Savino, MD  04/25/2017 4:08 PM    [facility]

## 2017-04-25 ENCOUNTER — Ambulatory Visit (INDEPENDENT_AMBULATORY_CARE_PROVIDER_SITE_OTHER): Payer: BLUE CROSS/BLUE SHIELD | Admitting: Cardiology

## 2017-04-25 ENCOUNTER — Other Ambulatory Visit: Payer: Self-pay | Admitting: *Deleted

## 2017-04-25 ENCOUNTER — Encounter: Payer: Self-pay | Admitting: Cardiology

## 2017-04-25 DIAGNOSIS — Z794 Long term (current) use of insulin: Secondary | ICD-10-CM | POA: Diagnosis not present

## 2017-04-25 DIAGNOSIS — E089 Diabetes mellitus due to underlying condition without complications: Secondary | ICD-10-CM | POA: Diagnosis not present

## 2017-04-25 DIAGNOSIS — I10 Essential (primary) hypertension: Secondary | ICD-10-CM

## 2017-04-25 DIAGNOSIS — R55 Syncope and collapse: Secondary | ICD-10-CM | POA: Diagnosis not present

## 2017-04-25 MED ORDER — LOSARTAN POTASSIUM 50 MG PO TABS: 50.0000 mg | ORAL_TABLET | Freq: Every day | ORAL | 3 refills | Status: DC

## 2017-04-25 MED ORDER — METOPROLOL SUCCINATE ER 50 MG PO TB24: 50.0000 mg | ORAL_TABLET | Freq: Every morning | ORAL | 3 refills | Status: DC

## 2017-04-25 MED ORDER — METOPROLOL TARTRATE 25 MG PO TABS: 12.5000 mg | ORAL_TABLET | Freq: Two times a day (BID) | ORAL | 1 refills | Status: DC

## 2017-04-25 NOTE — Patient Instructions (Addendum)
Medication Instructions:  Your physician has recommended you make the following change in your medication:  CHANGE Metoprolol to 25 mg twice a day CHANGE losartan to 50 mg daily  Labwork: None  Testing/Procedures: Your physician has requested that you have a lexiscan myoview. For further information please visit [email or web address]. Please follow instruction sheet, as given.  Your physician has recommended that you wear an event monitor. Event monitors are medical devices that record the heart's electrical activity. Doctors most often us these monitors to diagnose arrhythmias. Arrhythmias are problems with the speed or rhythm of the heartbeat. The monitor is a small, portable device. You can wear one while you do your normal daily activities. This is usually used to diagnose what is causing palpitations/syncope (passing out).   Follow-Up: Your physician recommends that you schedule a follow-up appointment in: 6 weeks  Any Other Special Instructions Will Be Listed Below (If Applicable).     If you need a refill on your cardiac medications before your next appointment, please call your pharmacy.   [facility]  Garey HamAshley A, RN, BSN  Check your BP daily sitting and standing

## 2017-05-03 ENCOUNTER — Telehealth (HOSPITAL_COMMUNITY): Payer: Self-pay | Admitting: *Deleted

## 2017-05-03 NOTE — Telephone Encounter (Signed)
Patient given detailed instructions per Myocardial Perfusion Study Information Sheet for the test on 05/05/17. Patient notified to arrive 15 minutes early and that it is imperative to arrive on time for appointment to keep from having the test rescheduled.  If you need to cancel or reschedule your appointment, please call the office within 24 hours of your appointment. . Patient verbalized understanding. Terin Dierolf Jacqueline    

## 2017-05-08 ENCOUNTER — Ambulatory Visit (INDEPENDENT_AMBULATORY_CARE_PROVIDER_SITE_OTHER): Payer: BLUE CROSS/BLUE SHIELD

## 2017-05-08 ENCOUNTER — Ambulatory Visit (HOSPITAL_COMMUNITY): Payer: BLUE CROSS/BLUE SHIELD | Attending: Cardiology

## 2017-05-08 VITALS — Ht 73.0 in | Wt 204.0 lb

## 2017-05-08 DIAGNOSIS — Z794 Long term (current) use of insulin: Secondary | ICD-10-CM

## 2017-05-08 DIAGNOSIS — E089 Diabetes mellitus due to underlying condition without complications: Secondary | ICD-10-CM | POA: Diagnosis not present

## 2017-05-08 DIAGNOSIS — I10 Essential (primary) hypertension: Secondary | ICD-10-CM | POA: Diagnosis not present

## 2017-05-08 DIAGNOSIS — R55 Syncope and collapse: Secondary | ICD-10-CM | POA: Diagnosis present

## 2017-05-08 LAB — MYOCARDIAL PERFUSION IMAGING
CSEPPHR: 72 {beats}/min
LHR: 0.26
LV dias vol: 131 mL (ref 62–150)
LV sys vol: 58 mL
Rest HR: 56 {beats}/min
SDS: 1
SRS: 4
SSS: 5
TID: 0.94

## 2017-05-08 MED ORDER — TECHNETIUM TC 99M TETROFOSMIN IV KIT
10.6000 | PACK | Freq: Once | INTRAVENOUS | Status: AC | PRN
  Administered 2017-05-08: 10.6 via INTRAVENOUS
  Filled 2017-05-08: qty 11

## 2017-05-08 MED ORDER — REGADENOSON 0.4 MG/5ML IV SOLN
0.4000 mg | Freq: Once | INTRAVENOUS | Status: AC
  Administered 2017-05-08: 0.4 mg via INTRAVENOUS

## 2017-05-08 MED ORDER — TECHNETIUM TC 99M TETROFOSMIN IV KIT
31.6000 | PACK | Freq: Once | INTRAVENOUS | Status: AC | PRN
Start: 2017-05-08 — End: 2017-05-08
  Administered 2017-05-08: 31.6 via INTRAVENOUS
  Filled 2017-05-08: qty 32

## 2017-05-30 ENCOUNTER — Telehealth: Payer: Self-pay | Admitting: Cardiology

## 2017-05-30 NOTE — Telephone Encounter (Signed)
Mailed in monitor in a while back ago but found a piece of it recently and wants to know if he needs to mail it back and if so what is the address

## 2017-05-30 NOTE — Telephone Encounter (Signed)
Advised patient he could bring it to the office at his follow up appointment

## 2017-06-05 NOTE — Progress Notes (Signed)
Cardiology Office Note:    Date:  06/06/2017   ID:  Joshua Monroe, DOB 08-May-1956, MRN [id number]  PCP:  Homero Fellers, MD  Cardiologist:  Norman Herrlich, MD    Referring MD: Homero Fellers, MD    ASSESSMENT:    1. Syncope, unspecified syncope type    PLAN:    In order of problems listed above:  1. He has had no recurrence of his cardiac testing myocardial perfusion study event monitors are normal.  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  At this time I perform no further cardiac testing if he had another unexplained event and implanted loop recorder would be appropriate.  He will see me back in the office as needed.   Next appointment: In the future as needed   Medication Adjustments/Labs and Tests Ordered: Current medicines are reviewed at length with the patient today.  Concerns regarding medicines are outlined above.  No orders of the defined types were placed in this encounter.  No orders of the defined types were placed in this encounter.   Chief Complaint  Patient presents with  . Follow-up    syncope    History of Present Illness:    Joshua Monroe is a 62 y.o. male with a hx of syncope last seen one month ago. Compliance with diet, lifestyle and medications: Yes he is quite close attention to diet Philmore Lepore is quite reassured with the normal cardiac testing he has made sure to eat the days he has church services when he takes insulin and metformin and he is recovered from his back and knee pain from his syncopal episode.  He is having no chest pain shortness of breath palpitation or syncope. Past Medical History:  Diagnosis Date  . Achilles tendon injury 11/20/2014  . Arthritis   . Diabetes mellitus without complication (HCC)   . GERD (gastroesophageal reflux disease)   . Hypertension     Past Surgical History:  Procedure Laterality Date  . ACHILLES  TENDON SURGERY Left 11/20/2014   Procedure: OPEN REPAIR OF A LEFT ACHILLES TENDON RUPTURE;  Surgeon: Ranee Gosselin, MD;  Location: [facility];  Service: Orthopedics;  Laterality: Left;  . APPENDECTOMY    . CHOLECYSTECTOMY    . TONSILLECTOMY      Current Medications: Current Meds  Medication Sig  . HUMALOG 100 UNIT/ML injection USE UP TO 100 UNITS PER DAY WITH INSULIN PUMP  . Insulin Human (INSULIN PUMP) SOLN Inject into the skin. Humalog  . losartan (COZAAR) 50 MG tablet Take 1 tablet (50 mg total) by mouth daily.  . metFORMIN (GLUCOPHAGE) 1000 MG tablet Take 2 tablets by mouth every morning.  . metoprolol tartrate (LOPRESSOR) 25 MG tablet Take 0.5 tablets (12.5 mg total) by mouth 2 (two) times daily.  . RABEprazole (ACIPHEX) 20 MG tablet Take 20 mg by mouth every morning.  . TURMERIC PO Take 1,050 mg by mouth every morning.      Allergies:   Octreotide acetate; Other; and Simvastatin   Social History   Socioeconomic History  . Marital status: Married    Spouse name: None  . Number of children: None  . Years of education: None  . Highest education level: None  Social Needs  . Financial resource strain: None  . Food insecurity - worry: None  . Food insecurity - inability: None  . Transportation needs - medical: None  . Transportation  needs - non-medical: None  Occupational History  . None  Tobacco Use  . Smoking status: Never Smoker  . Smokeless tobacco: Never Used  Substance and Sexual Activity  . Alcohol use: No  . Drug use: No  . Sexual activity: None  Other Topics Concern  . None  Social History Narrative  . None     Family History: The patient's family history includes Diabetes (age of onset: 5030) in his mother; Healthy in his son. ROS:   Please see the history of present illness.    All other systems reviewed and are negative.  EKGs/Labs/Other Studies Reviewed:    The following studies were reviewed today:  MPI 05/08/17: Study Highlights    Nuclear  stress EF: 55%. No wall motion abnormalities  There was no ST segment deviation noted during stress.  There is subtle mid anterior septal wall perfusion defect that is small in size, nonreversible seen at both rest and stress as well as basal inferolateral lateral abnormality seen at both rest and stress. No significant ischemia is identified.  This is a low risk study.   Event monitor: no arrhythmia captured  Recent Labs: No results found for requested labs within last 8760 hours.  Recent Lipid Panel No results found for: CHOL, TRIG, HDL, CHOLHDL, VLDL, LDLCALC, LDLDIRECT  Physical Exam:    VS:  BP 128/64   Pulse 60   Ht 6\' 1"  (1.854 m)   Wt 200 lb (90.7 kg)   SpO2 99%   BMI 26.39 kg/m     Wt Readings from Last 3 Encounters:  06/06/17 200 lb (90.7 kg)  05/08/17 204 lb (92.5 kg)  04/25/17 204 lb 12.8 oz (92.9 kg)     GEN:  Well nourished, well developed in no acute distress HEENT: Normal NECK: No JVD; No carotid bruits LYMPHATICS: No lymphadenopathy CARDIAC: RRR, no murmurs, rubs, gallops RESPIRATORY:  Clear to auscultation without rales, wheezing or rhonchi  ABDOMEN: Soft, non-tender, non-distended MUSCULOSKELETAL:  No edema; No deformity  SKIN: Warm and dry NEUROLOGIC:  Alert and oriented x 3 PSYCHIATRIC:  Normal affect    Signed, Norman HerrlichBrian Ebenezer Mccaskey, MD  06/06/2017 11:15 AM    [facility]

## 2017-06-06 ENCOUNTER — Encounter: Payer: Self-pay | Admitting: Cardiology

## 2017-06-06 ENCOUNTER — Ambulatory Visit (INDEPENDENT_AMBULATORY_CARE_PROVIDER_SITE_OTHER): Payer: BLUE CROSS/BLUE SHIELD | Admitting: Cardiology

## 2017-06-06 VITALS — BP 128/64 | HR 60 | Ht 73.0 in | Wt 200.0 lb

## 2017-06-06 DIAGNOSIS — R55 Syncope and collapse: Secondary | ICD-10-CM | POA: Diagnosis not present

## 2017-06-06 NOTE — Patient Instructions (Signed)
Medication Instructions:  Your physician recommends that you continue on your current medications as directed. Please refer to the Current Medication list given to you today.   Labwork: None  Testing/Procedures: None  Follow-Up: Your physician recommends that you schedule a follow-up as needed, if symptoms worsen or fail to improve   Any Other Special Instructions Will Be Listed Below (If Applicable).     If you need a refill on your cardiac medications before your next appointment, please call your pharmacy.

## 2017-11-06 ENCOUNTER — Ambulatory Visit
Admission: RE | Admit: 2017-11-06 | Discharge: 2017-11-06 | Disposition: A | Discharge status: Home / Self Care | Payer: BLUE CROSS/BLUE SHIELD | Source: Ambulatory Visit | Attending: Internal Medicine | Admitting: Internal Medicine

## 2017-11-06 ENCOUNTER — Other Ambulatory Visit: Payer: Self-pay | Admitting: Internal Medicine

## 2017-11-06 DIAGNOSIS — R059 Cough, unspecified: Secondary | ICD-10-CM

## 2017-11-06 DIAGNOSIS — R05 Cough: Secondary | ICD-10-CM

## 2018-01-17 ENCOUNTER — Telehealth: Payer: Self-pay

## 2018-01-17 MED ORDER — METOPROLOL TARTRATE 25 MG PO TABS: 12.5000 mg | ORAL_TABLET | Freq: Two times a day (BID) | ORAL | 0 refills | Status: DC

## 2018-01-17 NOTE — Telephone Encounter (Signed)
Rx sent to pharmacy as requested.

## 2018-04-22 DIAGNOSIS — J189 Pneumonia, unspecified organism: Secondary | ICD-10-CM | POA: Insufficient documentation

## 2018-04-22 HISTORY — DX: Pneumonia, unspecified organism: J18.9

## 2018-05-10 ENCOUNTER — Telehealth: Payer: Self-pay

## 2018-05-10 MED ORDER — LOSARTAN POTASSIUM 50 MG PO TABS: 50.0000 mg | ORAL_TABLET | Freq: Every day | ORAL | 0 refills | Status: DC

## 2018-05-10 NOTE — Telephone Encounter (Signed)
[facility] for losartan 50mg  daily [facility]. Pt to see Dr. Dulce SellarMunley PRN. Pt to get further refills through PCP.

## 2018-05-11 ENCOUNTER — Telehealth: Payer: Self-pay | Admitting: Cardiology

## 2018-05-11 NOTE — Telephone Encounter (Signed)
Patient [place] and [place] to [place] with shortness of breath.  Patient states he was diagnosed with flu and pneumonia.  He states that CXR indicates something wrong with his left lung.  Patient would like referral to pulmonologist.  Patient advised to get referral from PCP as the last time he was seen here he was told we would see him on an as needed basis.  Patient prefers to see Dr Dulce SellarMunley regarding this matter as he values Dr Thersa SaltMunleys opinion.  Patient was scheduled to see Dr Dulce SellarMunley on 05-17-18.  Patient aware of appt date and time.

## 2018-05-11 NOTE — Telephone Encounter (Signed)
Patient is requesting a referral to pulmonology due to issues.

## 2018-05-14 NOTE — Progress Notes (Signed)
Cardiology Office Note:    Date:  05/17/2018   ID:  Joshua Monroe, DOB 12/27/1955, MRN [id number]  PCP:  Homero FellersJaff, Twana R, MD  Cardiologist:  Norman HerrlichBrian Munley, MD    Referring MD: Homero FellersJaff, Twana R, MD    ASSESSMENT:    1. Abnormal CXR   2. Syncope, unspecified syncope type   3. Shortness of breath   4. Essential hypertension    PLAN:    In order of problems listed above:  1. He has 4 x-ray films all describing the same finding as pleural effusion elevated hemidiaphragm and atelectasis.  I have asked him to get a high contrast CTA performed and refer him to pulmonary.  He also needs to be seen for shortness of breath and chronic cough ARB discontinued continue ARB substitute calcium channel blocker and another agent as needed I put him on a low-dose diuretic 2. Stable no recurrence in retrospect he was having symptomatic hypoglycemia masked by his beta-blocker 3. Check CT refer to pulmonary his request 4. Not at target substitute calcium channel blocker and another agent as needed I put him on a low-dose diuretic    Next appointment: 1 month   Medication Adjustments/Labs and Tests Ordered: Current medicines are reviewed at length with the patient today.  Concerns regarding medicines are outlined above.  Orders Placed This Encounter  Procedures  . CT Chest Wo Contrast  . Ambulatory referral to Pulmonology  . EKG 12-Lead   Meds ordered this encounter  Medications  . amLODipine (NORVASC) 5 MG tablet    Sig: Take 1 tablet (5 mg total) by mouth daily.    Dispense:  30 tablet    Refill:  6    Chief Complaint  Patient presents with  . Follow-up    pleural effusion    History of Present Illness:    Joshua Monroe is a 62 y.o. male with a hx of diabetes and syncope suspected to be due to hypoglycemia with a normal cardiology evaluation earlier this year last seen by me 06/06/2017.  Legibly seen as a follow-up to emergency room visit in [place] with a pulmonary abnormality.  He  recently had 2 chest x-rays performed the first 1 on 05/04/2018 talks about a small left pleural effusion second 1 for follow-up 1216 says there is no pleural effusion but there is elevation of the left hemidiaphragm.  Lung volumes are remarked upon is being normal.  There is no evidence of pneumonia.  Second x-ray says that he was  influenza A positive and the indication for the chest x-rays was shortness of breath Compliance with diet, lifestyle and medications: Yes  Joshua Monroe is seen in the office is requested influenza was seen in urgent care in [place] 05/04/2018 and chest x-ray is normal except for was interpreted as a small pleural effusion.  3 days later follow-up chest x-ray was interpreted as elevated left hemidiaphragm unchanged chest x-ray from June 2019 showed atelectasis of the left base and a CTA of the abdomen showed left basilar scarring 2017.  He is recovered from his influenza but complains that he has a chronic cough with some sputum production but clear and frothy is advised to see a pulmonologist he asked for referral I complied.  He has some shortness of breath with activities like tennis but is not having wheezing edema orthopnea.  He takes an ARB his blood pressure is not at target and we will switch to calcium channel blocker.  He has esophageal reflux and takes a  PPI with good results.  For further evaluation and high contrast CT of the chest will be performed. Past Medical History:  Diagnosis Date  . Achilles tendon injury 11/20/2014  . Arthritis   . Diabetes mellitus without complication (HCC)   . GERD (gastroesophageal reflux disease)   . Hypertension     Past Surgical History:  Procedure Laterality Date  . ACHILLES TENDON SURGERY Left 11/20/2014   Procedure: OPEN REPAIR OF A LEFT ACHILLES TENDON RUPTURE;  Surgeon: Ranee Gosselin, MD;  Location: [facility];  Service: Orthopedics;  Laterality: Left;  . APPENDECTOMY    . CHOLECYSTECTOMY    . TONSILLECTOMY      Current  Medications: Current Meds  Medication Sig  . HUMALOG 100 UNIT/ML injection USE UP TO 100 UNITS PER DAY WITH INSULIN PUMP  . Insulin Human (INSULIN PUMP) SOLN Inject into the skin. Humalog  . metFORMIN (GLUCOPHAGE) 1000 MG tablet Take 2 tablets by mouth every morning.  . metoprolol tartrate (LOPRESSOR) 25 MG tablet Take 0.5 tablets (12.5 mg total) by mouth 2 (two) times daily. Please get future refills from PCP  . RABEprazole (ACIPHEX) 20 MG tablet Take 20 mg by mouth every morning.  . TURMERIC PO Take 1,050 mg by mouth every morning.   . [DISCONTINUED] losartan (COZAAR) 50 MG tablet Take 1 tablet (50 mg total) by mouth daily.     Allergies:   Octreotide acetate; Other; and Simvastatin   Social History   Socioeconomic History  . Marital status: Married    Spouse name: Not on file  . Number of children: Not on file  . Years of education: Not on file  . Highest education level: Not on file  Occupational History  . Not on file  Social Needs  . Financial resource strain: Not on file  . Food insecurity:    Worry: Not on file    Inability: Not on file  . Transportation needs:    Medical: Not on file    Non-medical: Not on file  Tobacco Use  . Smoking status: Never Smoker  . Smokeless tobacco: Never Used  Substance and Sexual Activity  . Alcohol use: No  . Drug use: No  . Sexual activity: Not on file  Lifestyle  . Physical activity:    Days per week: Not on file    Minutes per session: Not on file  . Stress: Not on file  Relationships  . Social connections:    Talks on phone: Not on file    Gets together: Not on file    Attends religious service: Not on file    Active member of club or organization: Not on file    Attends meetings of clubs or organizations: Not on file    Relationship status: Not on file  Other Topics Concern  . Not on file  Social History Narrative  . Not on file     Family History: The patient's family history includes Diabetes (age of onset: 36)  in his mother; Healthy in his son. ROS:   Please see the history of present illness.    All other systems reviewed and are negative.  EKGs/Labs/Other Studies Reviewed:    The following studies were reviewed today:  EKG:  EKG ordered today.  The ekg ordered today demonstrates SRTH normal   Recent Labs: No results found for requested labs within last 8760 hours.  Recent Lipid Panel No results found for: CHOL, TRIG, HDL, CHOLHDL, VLDL, LDLCALC, LDLDIRECT  Physical Exam:  VS:  BP (!) 168/86   Pulse 90   Ht 6\' 1"  (1.854 m)   Wt 212 lb 1.3 oz (96.2 kg)   SpO2 97%   BMI 27.98 kg/m     Wt Readings from Last 3 Encounters:  05/17/18 212 lb 1.3 oz (96.2 kg)  06/06/17 200 lb (90.7 kg)  05/08/17 204 lb (92.5 kg)     GEN:  Well nourished, well developed in no acute distress HEENT: Normal NECK: No JVD; No carotid bruits LYMPHATICS: No lymphadenopathy CARDIAC: RRR, no murmurs, rubs, gallops RESPIRATORY:  Clear to auscultation without rales, wheezing or rhonchi decreased L base with dullness ABDOMEN: Soft, non-tender, non-distended MUSCULOSKELETAL:  No edema; No deformity  SKIN: Warm and dry NEUROLOGIC:  Alert and oriented x 3 PSYCHIATRIC:  Normal affect    Signed, Norman HerrlichBrian Munley, MD  05/17/2018 12:30 PM    [facility]

## 2018-05-17 ENCOUNTER — Ambulatory Visit (HOSPITAL_BASED_OUTPATIENT_CLINIC_OR_DEPARTMENT_OTHER)
Admission: RE | Admit: 2018-05-17 | Discharge: 2018-05-17 | Disposition: A | Discharge status: Home / Self Care | Payer: BLUE CROSS/BLUE SHIELD | Source: Ambulatory Visit | Attending: Cardiology | Admitting: Cardiology

## 2018-05-17 ENCOUNTER — Ambulatory Visit (INDEPENDENT_AMBULATORY_CARE_PROVIDER_SITE_OTHER): Payer: BLUE CROSS/BLUE SHIELD | Admitting: Cardiology

## 2018-05-17 ENCOUNTER — Encounter: Payer: Self-pay | Admitting: Cardiology

## 2018-05-17 VITALS — BP 168/86 | HR 90 | Ht 73.0 in | Wt 212.1 lb

## 2018-05-17 DIAGNOSIS — I10 Essential (primary) hypertension: Secondary | ICD-10-CM | POA: Diagnosis not present

## 2018-05-17 DIAGNOSIS — R55 Syncope and collapse: Secondary | ICD-10-CM | POA: Diagnosis not present

## 2018-05-17 DIAGNOSIS — R9389 Abnormal findings on diagnostic imaging of other specified body structures: Secondary | ICD-10-CM

## 2018-05-17 DIAGNOSIS — R0602 Shortness of breath: Secondary | ICD-10-CM | POA: Diagnosis not present

## 2018-05-17 MED ORDER — AMLODIPINE BESYLATE 5 MG PO TABS: 5.0000 mg | ORAL_TABLET | Freq: Every day | ORAL | 6 refills | Status: DC

## 2018-05-17 NOTE — Patient Instructions (Signed)
Medication Instructions:  Your physician has recommended you make the following change in your medication:   STOP losartan  START amlodipine (norvasc) 5 mg: Take 1 tablet daily   If you need a refill on your cardiac medications before your next appointment, please call your pharmacy.   Lab work: None  If you have labs (blood work) drawn today and your tests are completely normal, you will receive your results only by: Marland Kitchen. MyChart Message (if you have MyChart) OR . A paper copy in the mail If you have any lab test that is abnormal or we need to change your treatment, we will call you to review the results.  Testing/Procedures: You had an EKG today.   Non-Cardiac CT scanning, (CAT scanning), is a noninvasive, special x-ray that produces cross-sectional images of the body using x-rays and a computer. CT scans help physicians diagnose and treat medical conditions. For some CT exams, a contrast material is used to enhance visibility in the area of the body being studied. CT scans provide greater clarity and reveal more details than regular x-ray exams.  You have been referred to see a pulmonologist, Dr. Kendrick FriesMcQuaid. You will be contacted to schedule this appointment.   Follow-Up: At [facility], you and your health needs are our priority.  As part of our continuing mission to provide you with exceptional heart care, we have created designated Provider Care Teams.  These Care Teams include your primary Cardiologist (physician) and Advanced Practice Providers (APPs -  Physician Assistants and Nurse Practitioners) who all work together to provide you with the care you need, when you need it. You will need a follow up appointment in 6 months.  Please call our office 2 months in advance to schedule this appointment.      Amlodipine tablets What is this medicine? AMLODIPINE (am LOE di peen) is a calcium-channel blocker. It affects the amount of calcium found in your heart and muscle cells. This  relaxes your blood vessels, which can reduce the amount of work the heart has to do. This medicine is used to lower high blood pressure. It is also used to prevent chest pain. This medicine may be used for other purposes; ask your health care provider or pharmacist if you have questions. COMMON BRAND NAME(S): Norvasc What should I tell my health care provider before I take this medicine? They need to know if you have any of these conditions: -heart disease -liver disease -an unusual or allergic reaction to amlodipine, other medicines, foods, dyes, or preservatives -pregnant or trying to get pregnant -breast-feeding How should I use this medicine? Take this medicine by mouth with a glass of water. Follow the directions on the prescription label. You can take it with or without food. If it upsets your stomach, take it with food. Take your medicine at regular intervals. Do not take it more often than directed. Do not stop taking except on your doctor's advice. Talk to your pediatrician regarding the use of this medicine in children. While this drug may be prescribed for children as young as 6 years for selected conditions, precautions do apply. Patients over 365 years of age may have a stronger reaction and need a smaller dose. Overdosage: If you think you have taken too much of this medicine contact a poison control center or emergency room at once. NOTE: This medicine is only for you. Do not share this medicine with others. What if I miss a dose? If you miss a dose, take it as  soon as you can. If it is almost time for your next dose, take only that dose. Do not take double or extra doses. What may interact with this medicine? Do not take this medicine with any of the following medications: -tranylcypromine This medicine may also interact with the following medications: -clarithromycin -cyclosporine -diltiazem -itraconazole -simvastatin -tacrolimus This list may not describe all possible  interactions. Give your health care provider a list of all the medicines, herbs, non-prescription drugs, or dietary supplements you use. Also tell them if you smoke, drink alcohol, or use illegal drugs. Some items may interact with your medicine. What should I watch for while using this medicine? Visit your healthcare professional for regular checks on your progress. Check your blood pressure as directed. Ask your healthcare professional what your blood pressure should be and when you should contact him or her. Do not treat yourself for coughs, colds, or pain while you are using this medicine without asking your healthcare professional for advice. Some medicines may increase your blood pressure. You may get dizzy. Do not drive, use machinery, or do anything that needs mental alertness until you know how this medicine affects you. Do not stand or sit up quickly, especially if you are an older patient. This reduces the risk of dizzy or fainting spells. Avoid alcoholic drinks; they can make you dizzier. What side effects may I notice from receiving this medicine? Side effects that you should report to your doctor or health care professional as soon as possible: -allergic reactions like skin rash, itching or hives; swelling of the face, lips, or tongue -fast, irregular heartbeat -signs and symptoms of low blood pressure like dizziness; feeling faint or lightheaded, falls; unusually weak or tired -swelling of ankles, feet, hands Side effects that usually do not require medical attention (report these to your doctor or health care professional if they continue or are bothersome): -dry mouth -facial flushing -headache -stomach pain -tiredness This list may not describe all possible side effects. Call your doctor for medical advice about side effects. You may report side effects to FDA at 1-800-FDA-1088. Where should I keep my medicine? Keep out of the reach of children. Store at room temperature between  59 and 86 degrees F (15 and 30 degrees C). Throw away any unused medicine after the expiration date. NOTE: This sheet is a summary. It may not cover all possible information. If you have questions about this medicine, talk to your doctor, pharmacist, or health care provider.  2019 Elsevier/Gold Standard (2017-12-01 15:07:10)

## 2018-06-07 ENCOUNTER — Encounter: Payer: Self-pay | Admitting: Pulmonary Disease

## 2018-06-07 ENCOUNTER — Ambulatory Visit (INDEPENDENT_AMBULATORY_CARE_PROVIDER_SITE_OTHER): Payer: BLUE CROSS/BLUE SHIELD | Admitting: Pulmonary Disease

## 2018-06-07 VITALS — BP 150/86 | HR 78 | Ht 73.0 in | Wt 218.0 lb

## 2018-06-07 DIAGNOSIS — R911 Solitary pulmonary nodule: Secondary | ICD-10-CM

## 2018-06-07 DIAGNOSIS — J479 Bronchiectasis, uncomplicated: Secondary | ICD-10-CM

## 2018-06-07 DIAGNOSIS — J948 Other specified pleural conditions: Secondary | ICD-10-CM | POA: Diagnosis not present

## 2018-06-07 NOTE — Progress Notes (Signed)
Synopsis: Referred in Jan 2020 for dyspnea in the setting of a recent episode of pneumonia and influenza.   Subjective:   PATIENT ID: Joshua Monroe GENDER: male DOB: 1955/09/11, MRN: [id number]   HPI  Chief Complaint  Patient presents with  . Consult    referred by Dr. Dulce Sellar for SOB.      Joshua Monroe is a pleasant 63 year old non-smoking male who comes to my clinic today for evaluation of some shortness of breath and cough that is been persistent for about 2 months at this point.  He said the symptoms started insidiously without any sort of body aches, fevers or chills.  However he did have a significant amount of mucus production.  His symptoms worsened specifically the cough, mucus production and shortness of breath while he was traveling to [facility] and while there he had a chest x-ray performed which was read as markedly abnormal with a left hemidiaphragm and fluid in his left lung.  He was treated for pneumonia and influenza.  He followed up with his cardiologist who ordered a CT scan of his chest a couple weeks ago which does show an elevated left hemidiaphragm as well as some nodular densities along the left lung.  He is here today to follow-up with me.  He says in the last week his cough has improved but he still has problems with cough when he lays on his left side.  He says that he is overall energy level and fatigue is increasing but he still has some shortness of breath when he plays tennis.  Past Medical History:  Diagnosis Date  . Achilles tendon injury 11/20/2014  . Arthritis   . Diabetes mellitus without complication (HCC)   . GERD (gastroesophageal reflux disease)   . Hypertension      Family History  Problem Relation Age of Onset  . Diabetes Mother 30       AGE 63 DIED WITH DIBETIC COMA  . Healthy Son      Social History   Socioeconomic History  . Marital status: Married    Spouse name: Not on file  . Number of children: Not on file  . Years of education:  Not on file  . Highest education level: Not on file  Occupational History  . Not on file  Social Needs  . Financial resource strain: Not on file  . Food insecurity:    Worry: Not on file    Inability: Not on file  . Transportation needs:    Medical: Not on file    Non-medical: Not on file  Tobacco Use  . Smoking status: Never Smoker  . Smokeless tobacco: Never Used  Substance and Sexual Activity  . Alcohol use: No  . Drug use: No  . Sexual activity: Not on file  Lifestyle  . Physical activity:    Days per week: Not on file    Minutes per session: Not on file  . Stress: Not on file  Relationships  . Social connections:    Talks on phone: Not on file    Gets together: Not on file    Attends religious service: Not on file    Active member of club or organization: Not on file    Attends meetings of clubs or organizations: Not on file    Relationship status: Not on file  . Intimate partner violence:    Fear of current or ex partner: Not on file    Emotionally abused: Not on file  Physically abused: Not on file    Forced sexual activity: Not on file  Other Topics Concern  . Not on file  Social History Narrative  . Not on file     Allergies  Allergen Reactions  . Octreotide Acetate Other (See Comments)    Pain and tightness in muscle   . Other Other (See Comments)    Sandostatin --Pain and tightness in muscle  . Simvastatin Other (See Comments)    ATTACK MUSCLES     Outpatient Medications Prior to Visit  Medication Sig Dispense Refill  . amLODipine (NORVASC) 5 MG tablet Take 1 tablet (5 mg total) by mouth daily. 30 tablet 6  . HUMALOG 100 UNIT/ML injection USE UP TO 100 UNITS PER DAY WITH INSULIN PUMP  12  . Insulin Human (INSULIN PUMP) SOLN Inject into the skin. Humalog    . metFORMIN (GLUCOPHAGE) 1000 MG tablet Take 2 tablets by mouth every morning.    . RABEprazole (ACIPHEX) 20 MG tablet Take 20 mg by mouth every morning.    . TURMERIC PO Take 1,050 mg by  mouth every morning.     . metoprolol tartrate (LOPRESSOR) 25 MG tablet Take 0.5 tablets (12.5 mg total) by mouth 2 (two) times daily. Please get future refills from PCP (Patient not taking: Reported on 06/07/2018) 90 tablet 0   No facility-administered medications prior to visit.     Review of Systems  Constitutional: Negative for fever and weight loss.  HENT: Positive for congestion. Negative for ear pain, nosebleeds and sore throat.   Eyes: Negative for redness.  Respiratory: Positive for cough and shortness of breath. Negative for wheezing.   Cardiovascular: Negative for palpitations, leg swelling and PND.  Gastrointestinal: Negative for nausea and vomiting.  Genitourinary: Negative for dysuria.  Skin: Negative for rash.  Neurological: Negative for headaches.  Endo/Heme/Allergies: Does not bruise/bleed easily.  Psychiatric/Behavioral: Negative for depression. The patient is not nervous/anxious.       Objective:  Physical Exam   Vitals:   06/07/18 1601  BP: (!) 150/86  Pulse: 78  SpO2: 98%  Weight: 218 lb (98.9 kg)  Height: 6\' 1"  (1.854 m)    Gen: well appearing, no acute distress HENT: NCAT, OP clear, neck supple without masses Eyes: PERRL, EOMi Lymph: no cervical lymphadenopathy PULM: CTA B CV: RRR, no mgr, no JVD GI: BS+, soft, nontender, no hsm Derm: no rash or skin breakdown MSK: normal bulk and tone Neuro: A&Ox4, CN II-XII intact, strength 5/5 in all 4 extremities Psyche: normal mood and affect  CBC    Component Value Date/Time   WBC 7.4 11/20/2014 1600   RBC 4.01 (L) 11/20/2014 1600   HGB 10.5 (L) 11/20/2014 1600   HCT 32.8 (L) 11/20/2014 1600   PLT 187 11/20/2014 1600   MCV 81.8 11/20/2014 1600   MCH 26.2 11/20/2014 1600   MCHC 32.0 11/20/2014 1600   RDW 14.0 11/20/2014 1600   LYMPHSABS 1.2 11/19/2014 1155   MONOABS 0.9 11/19/2014 1155   EOSABS 0.1 11/19/2014 1155   BASOSABS 0.0 11/19/2014 1155     Chest imaging: December 2019 CT chest  images independently reviewed showing nodular densities along the pleura in the left base, there is also pleural thickening and some pleural bands seen with rounded atelectasis, there is also a very small focal area of bronchiectasis in the lingula.  Compared to a CT scan from 2017 there is actually not much change.  PFT:  Labs:  Path:  Echo:  Heart Catheterization:  Assessment & Plan:   Pleural scarring  Solitary pulmonary nodule - Plan: CT Chest Wo Contrast  Bronchiectasis without complication (HCC)  Discussion: Joshua Monroe has findings of scarring in the left lung which I suspect are related to a childhood or early adult respiratory infection.  The abnormality seen on the CT scan of his chest are strongly suggestive of a prior pleural inflammatory process which have not progressed over 2 years time so the likelihood of malignancy is fairly low.  However, there is a focal area of bronchiectasis there which I think makes him more susceptible to severe respiratory infections.  I think that the pleural scarring also contributes to some degree to the severity of dyspnea that he has.  That being said, I think the symptoms that he has had recently are all consistent with what is expected with pneumonia.  I explained to him that the symptoms should improve over the next 2 to 3 months.  Plan: Bronchiectasis: This is the medical term that means that you have an airway that is bigger and more dilated than it should be.  This makes you more susceptible to respiratory infections so in the event of cough, mucus production etc. you need to let us know right away so that we can prescribe an antibiotic If you have persistent daily chest congestion and mucus production then we will need to consider something called mucociliary clearance measures to help with this; at this time though I think your symptoms will continue to improve because you have a very mild area of bronchiectasis seen on the CT scan of  your chest  Pleural thickening: This is the medical term that means that the lining of your lung is thicker and denser than it should be in the left base, this has the appearance of a prior inflammatory or infectious process.  We need to keep a close eye on this and the nodular densities with a repeat CT scan in May 2020.  We will plan on seeing you back in May 2020 or sooner if needed    Current Outpatient Medications:  .  amLODipine (NORVASC) 5 MG tablet, Take 1 tablet (5 mg total) by mouth daily., Disp: 30 tablet, Rfl: 6 .  HUMALOG 100 UNIT/ML injection, USE UP TO 100 UNITS PER DAY WITH INSULIN PUMP, Disp: , Rfl: 12 .  Insulin Human (INSULIN PUMP) SOLN, Inject into the skin. Humalog, Disp: , Rfl:  .  metFORMIN (GLUCOPHAGE) 1000 MG tablet, Take 2 tablets by mouth every morning., Disp: , Rfl:  .  RABEprazole (ACIPHEX) 20 MG tablet, Take 20 mg by mouth every morning., Disp: , Rfl:  .  TURMERIC PO, Take 1,050 mg by mouth every morning. , Disp: , Rfl:

## 2018-06-07 NOTE — Patient Instructions (Signed)
Bronchiectasis: This is the medical term that means that you have an airway that is bigger and more dilated than it should be.  This makes you more susceptible to respiratory infections so in the event of cough, mucus production etc. you need to let us know right away so that we can prescribe an antibiotic If you have persistent daily chest congestion and mucus production then we will need to consider something called mucociliary clearance measures to help with this; at this time though I think your symptoms will continue to improve because you have a very mild area of bronchiectasis seen on the CT scan of your chest  Pleural thickening: This is the medical term that means that the lining of your lung is thicker and denser than it should be in the left base, this has the appearance of a prior inflammatory or infectious process.  We need to keep a close eye on this and the nodular densities with a repeat CT scan in May 2020.  We will plan on seeing you back in May 2020 or sooner if needed

## 2018-07-07 ENCOUNTER — Encounter: Payer: Self-pay | Admitting: Emergency Medicine

## 2018-07-07 ENCOUNTER — Emergency Department (INDEPENDENT_AMBULATORY_CARE_PROVIDER_SITE_OTHER): Payer: BLUE CROSS/BLUE SHIELD

## 2018-07-07 ENCOUNTER — Emergency Department (INDEPENDENT_AMBULATORY_CARE_PROVIDER_SITE_OTHER)
Admission: EM | Admit: 2018-07-07 | Discharge: 2018-07-07 | Disposition: A | Discharge status: Home / Self Care | Payer: BLUE CROSS/BLUE SHIELD | Source: Home / Self Care | Attending: Family Medicine | Admitting: Family Medicine

## 2018-07-07 ENCOUNTER — Other Ambulatory Visit: Payer: Self-pay

## 2018-07-07 DIAGNOSIS — R05 Cough: Secondary | ICD-10-CM

## 2018-07-07 DIAGNOSIS — B9789 Other viral agents as the cause of diseases classified elsewhere: Secondary | ICD-10-CM

## 2018-07-07 DIAGNOSIS — J069 Acute upper respiratory infection, unspecified: Secondary | ICD-10-CM | POA: Diagnosis not present

## 2018-07-07 HISTORY — DX: Pneumonia, unspecified organism: J18.9

## 2018-07-07 MED ORDER — DOXYCYCLINE HYCLATE 100 MG PO CAPS: 100.0000 mg | ORAL_CAPSULE | Freq: Two times a day (BID) | ORAL | 0 refills | Status: DC

## 2018-07-07 NOTE — ED Provider Notes (Signed)
[facility]    CSN: [id number] Arrival date & time: 07/07/18  1743     History   Chief Complaint Chief Complaint  Patient presents with  . Cough    HPI Joshua Monroe is a 63 y.o. male.   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  He complains of onset of a partly productive cough yesterday with scratchy throat and sinus congestion.  He denies fevers, chills, and sweats, and no shortness of breath or pleuritic pain.  The history is provided by the patient and the spouse.    Past Medical History:  Diagnosis Date  . Achilles tendon injury 11/20/2014  . Arthritis   . Diabetes mellitus without complication (HCC)   . GERD (gastroesophageal reflux disease)   . Hypertension   . Pneumonia 04/2018    Patient Active Problem List   Diagnosis Date Noted  . Syncope 04/24/2017  . Diabetes mellitus due to underlying condition (HCC) 04/24/2017  . Essential hypertension 04/24/2017  . Achilles tendon injury 11/20/2014    Past Surgical History:  Procedure Laterality Date  . ACHILLES TENDON SURGERY Left 11/20/2014   Procedure: OPEN REPAIR OF A LEFT ACHILLES TENDON RUPTURE;  Surgeon: Ranee Gosselinonald Gioffre, MD;  Location: [facility];  Service: Orthopedics;  Laterality: Left;  . APPENDECTOMY    . CHOLECYSTECTOMY    . TONSILLECTOMY         Home Medications    Prior to Admission medications   Medication Sig Start Date End Date Taking? Authorizing Provider  amLODipine (NORVASC) 5 MG tablet Take 1 tablet (5 mg total) by mouth daily. 05/17/18 08/15/18  Baldo DaubMunley, Brian J, MD  doxycycline (VIBRAMYCIN) 100 MG capsule Take 1 capsule (100 mg total) by mouth 2 (two) times daily. Take with food. 07/07/18   Lattie HawBeese, Rosemary Pentecost A, MD  HUMALOG 100 UNIT/ML injection USE UP TO 100 UNITS PER DAY WITH INSULIN PUMP 06/03/17   [provider]    Insulin Human (INSULIN PUMP) SOLN Inject into the skin. Humalog    [provider]  metFORMIN (GLUCOPHAGE) 1000 MG tablet Take 2 tablets by mouth every morning. 07/03/14   [provider]  RABEprazole (ACIPHEX) 20 MG tablet Take 20 mg by mouth every morning.    [provider]  TURMERIC PO Take 1,050 mg by mouth every morning.     [provider]    Family History Family History  Problem Relation Age of Onset  . Diabetes Mother 30       AGE 62 DIED WITH DIBETIC COMA  . Healthy Son     Social History Social History   Tobacco Use  . Smoking status: Never Smoker  . Smokeless tobacco: Never Used  Substance Use Topics  . Alcohol use: No  . Drug use: No     Allergies   Octreotide acetate; Other; and Simvastatin   Review of Systems Review of Systems + sore throat + cough No pleuritic pain No wheezing ? nasal congestion + post-nasal drainage No sinus pain/pressure No itchy/red eyes No earache No hemoptysis No SOB No fever/chills No nausea No vomiting No abdominal pain No diarrhea No urinary symptoms No skin rash + fatigue No myalgias No headache Used OTC meds without relief   Physical Exam Triage Vital Signs ED Triage Vitals [07/07/18 1816]  Enc Vitals Group     BP (!) 147/70  Pulse Rate 76     Resp 18     Temp 97.8 F (36.6 C)     Temp Source Oral     SpO2 99 %     Weight 207 lb (93.9 kg)     Height 6\' 1"  (1.854 m)     Head Circumference      Peak Flow      Pain Score 0     Pain Loc      Pain Edu?      Excl. in GC?    No data found.  Updated Vital Signs BP (!) 147/70 (BP Location: Right Arm)   Pulse 76   Temp 97.8 F (36.6 C) (Oral)   Resp 18   Ht 6\' 1"  (1.854 m)   Wt 93.9 kg   SpO2 99%   BMI 27.31 kg/m   Visual Acuity Right Eye Distance:   Left Eye Distance:   Bilateral Distance:    Right Eye Near:   Left Eye Near:    Bilateral Near:     Physical Exam Nursing notes and Vital Signs  reviewed. Appearance:  Patient appears stated age, and in no acute distress Eyes:  Pupils are equal, round, and reactive to light and accomodation.  Extraocular movement is intact.  Conjunctivae are not inflamed  Ears:  Canals normal.  Tympanic membranes normal.  Nose:  Mildly congested turbinates.  No sinus tenderness.   Pharynx:  Normal Neck:  Supple.  Enlarged posterior/lateral nodes are palpated bilaterally, tender to palpation on the left.   Lungs:  Clear to auscultation.  Breath sounds are equal.  Moving air well. Heart:  Regular rate and rhythm without murmurs, rubs, or gallops.  Abdomen:  Nontender without masses or hepatosplenomegaly.  Bowel sounds are present.  No CVA or flank tenderness.  Extremities:  No edema.  Skin:  No rash present.    UC Treatments / Results  Labs (all labs ordered are listed, but only abnormal results are displayed) Labs Reviewed - No data to display  EKG None  Radiology Dg Chest 2 View  Result Date: 07/07/2018 CLINICAL DATA:  Persistent cough with hx flu in December. Congeestion. Hx Pneumonia, Lt lung abnormality. htn, diabetic and gerd. Non-smokerpneumonia and influenza 12/19; lung nodules via CT scan; productive cough since yesterday EXAM: CHEST - 2 VIEW COMPARISON:  None. FINDINGS: Normal mediastinum and cardiac silhouette. Chronic elevation of the LEFT hemidiaphragm. Normal pulmonary vasculature. No evidence of effusion, infiltrate, or pneumothorax. No acute bony abnormality. IMPRESSION: Chronic elevation LEFT hemidiaphragm.  No acute findings Electronically Signed   By: Genevive Bi M.D.   On: 07/07/2018 19:09    Procedures Procedures (including critical care time)  Medications Ordered in UC Medications - No data to display  Initial Impression / Assessment and Plan / UC Course  I have reviewed the triage vital signs and the nursing notes.  Pertinent labs & imaging results that were available during my care of the patient were reviewed by  me and considered in my medical decision making (see chart for details).    Suspect early viral URI.  Because of his recent history of pneumonia and recent abnormal CT chest, will begin doxycycline. Followup with Family Doctor if not improved in 10 days.   Final Clinical Impressions(s) / UC Diagnoses   Final diagnoses:  Viral URI with cough     Discharge Instructions     Take plain guaifenesin (1200mg  extended release tabs such as Mucinex) twice daily, with plenty of water,  for cough and congestion.  Get adequate rest.   For sinus congestion, may use Afrin nasal spray (or generic oxymetazoline) each morning for about 5 days and then discontinue.  Also recommend using saline nasal spray several times daily and saline nasal irrigation (AYR is a common brand).   Try warm salt water gargles for sore throat.  Stop all antihistamines for now, and other non-prescription cough/cold preparations. May take Delsym Cough Suppressant at bedtime for nighttime cough.  May take Tylenol as needed for fever, headache, etc.        ED Prescriptions    Medication Sig Dispense Auth. Provider   doxycycline (VIBRAMYCIN) 100 MG capsule Take 1 capsule (100 mg total) by mouth 2 (two) times daily. Take with food. 20 capsule Lattie Haw, MD        Lattie Haw, MD 07/10/18 2218

## 2018-07-07 NOTE — Discharge Instructions (Addendum)
Take plain guaifenesin (1200mg  extended release tabs such as Mucinex) twice daily, with plenty of water, for cough and congestion.  Get adequate rest.   For sinus congestion, may use Afrin nasal spray (or generic oxymetazoline) each morning for about 5 days and then discontinue.  Also recommend using saline nasal spray several times daily and saline nasal irrigation (AYR is a common brand).   Try warm salt water gargles for sore throat.  Stop all antihistamines for now, and other non-prescription cough/cold preparations. May take Delsym Cough Suppressant at bedtime for nighttime cough.  May take Tylenol as needed for fever, headache, etc.

## 2018-07-07 NOTE — ED Triage Notes (Addendum)
Patient has had pulmonary problems since 10/19; pneumonia and influenza A late December/19; yesterday he began coughing harshly with green production; no fever. His pulmonologist on call said he should be evaluated for need of antibiotics.

## 2018-09-17 DIAGNOSIS — Z0000 Encounter for general adult medical examination without abnormal findings: Secondary | ICD-10-CM | POA: Insufficient documentation

## 2018-09-17 HISTORY — DX: Encounter for general adult medical examination without abnormal findings: Z00.00

## 2018-11-13 ENCOUNTER — Other Ambulatory Visit: Payer: Self-pay | Admitting: Gastroenterology

## 2018-11-13 DIAGNOSIS — R140 Abdominal distension (gaseous): Secondary | ICD-10-CM

## 2018-11-15 ENCOUNTER — Other Ambulatory Visit: Payer: Self-pay | Admitting: Gastroenterology

## 2018-11-20 ENCOUNTER — Ambulatory Visit
Admission: RE | Admit: 2018-11-20 | Discharge: 2018-11-20 | Disposition: A | Discharge status: Home / Self Care | Payer: BC Managed Care – PPO | Source: Ambulatory Visit | Attending: Gastroenterology | Admitting: Gastroenterology

## 2018-11-20 ENCOUNTER — Other Ambulatory Visit: Payer: Self-pay

## 2018-11-20 DIAGNOSIS — R140 Abdominal distension (gaseous): Secondary | ICD-10-CM

## 2018-11-20 MED ORDER — IOPAMIDOL (ISOVUE-300) INJECTION 61%
100.0000 mL | Freq: Once | INTRAVENOUS | Status: AC | PRN
  Administered 2018-11-20: 100 mL via INTRAVENOUS

## 2018-12-04 ENCOUNTER — Telehealth: Payer: Self-pay | Admitting: Pulmonary Disease

## 2018-12-04 ENCOUNTER — Ambulatory Visit (HOSPITAL_BASED_OUTPATIENT_CLINIC_OR_DEPARTMENT_OTHER): Payer: BC Managed Care – PPO

## 2018-12-04 NOTE — Telephone Encounter (Addendum)
The CT was cancelled yesterday at 4:00 by Destiny Best.  I notified the patient.  I worked on trying to obtain the authorization all day yesterday & called the patient to let him know the appointment had been canceled by the facility because I wasn't sure if the facility had called the patient to make aware of appointment being canceled. .  I am still awaiting authorization from pt's insurance at this time.  This was initiated on 11/30/2018 with [employer] by [employer].

## 2018-12-04 NOTE — Telephone Encounter (Signed)
Got Auth from [facility] from 12/03/2018 to 06/01/2019 auth# [id number].  Called & spoke to [facility].  They will contact the patient to reschedule this appt.  Nothing further needed at this time.

## 2018-12-04 NOTE — Telephone Encounter (Signed)
PCCs, please advise on this for pt and [facility] in regards to pt's CT. Thanks!

## 2018-12-06 ENCOUNTER — Other Ambulatory Visit: Payer: Self-pay | Admitting: Cardiology

## 2018-12-06 ENCOUNTER — Telehealth: Payer: Self-pay | Admitting: Pulmonary Disease

## 2018-12-06 ENCOUNTER — Other Ambulatory Visit: Payer: Self-pay

## 2018-12-06 MED ORDER — AMLODIPINE BESYLATE 5 MG PO TABS: 5.0000 mg | ORAL_TABLET | Freq: Every day | ORAL | 0 refills | Status: DC

## 2018-12-06 NOTE — Telephone Encounter (Signed)
Rx for amlodipine 5mg  one tablet daily sent to pharmacy as requested.

## 2018-12-06 NOTE — Telephone Encounter (Signed)
Call returned to patient, made aware of the below:  Got Auth from [facility] from 12/03/2018 to 06/01/2019 auth# [id number].  Called & spoke to [facility].  They will contact the patient to reschedule this appt.  Nothing further needed at this time.     Voiced understanding. Nothing further is needed at this time.

## 2018-12-19 ENCOUNTER — Ambulatory Visit (HOSPITAL_BASED_OUTPATIENT_CLINIC_OR_DEPARTMENT_OTHER)
Admission: RE | Admit: 2018-12-19 | Discharge: 2018-12-19 | Disposition: A | Discharge status: Home / Self Care | Payer: BC Managed Care – PPO | Source: Ambulatory Visit | Attending: Pulmonary Disease | Admitting: Pulmonary Disease

## 2018-12-19 ENCOUNTER — Other Ambulatory Visit: Payer: Self-pay

## 2018-12-19 DIAGNOSIS — R911 Solitary pulmonary nodule: Secondary | ICD-10-CM | POA: Insufficient documentation

## 2019-01-04 ENCOUNTER — Other Ambulatory Visit: Payer: Self-pay | Admitting: Cardiology

## 2019-01-14 DIAGNOSIS — M7662 Achilles tendinitis, left leg: Secondary | ICD-10-CM | POA: Insufficient documentation

## 2019-01-14 HISTORY — DX: Achilles tendinitis, left leg: M76.62

## 2019-01-19 ENCOUNTER — Other Ambulatory Visit: Payer: Self-pay | Admitting: Cardiology

## 2019-01-30 DIAGNOSIS — N181 Chronic kidney disease, stage 1: Secondary | ICD-10-CM

## 2019-01-30 HISTORY — DX: Chronic kidney disease, stage 1: N18.1

## 2019-07-16 ENCOUNTER — Other Ambulatory Visit: Payer: Self-pay | Admitting: Cardiology

## 2019-07-17 DIAGNOSIS — M546 Pain in thoracic spine: Secondary | ICD-10-CM | POA: Insufficient documentation

## 2019-07-17 DIAGNOSIS — M5134 Other intervertebral disc degeneration, thoracic region: Secondary | ICD-10-CM | POA: Insufficient documentation

## 2019-07-17 HISTORY — DX: Pain in thoracic spine: M54.6

## 2019-07-17 HISTORY — DX: Other intervertebral disc degeneration, thoracic region: M51.34

## 2019-08-12 DIAGNOSIS — H9201 Otalgia, right ear: Secondary | ICD-10-CM

## 2019-08-12 DIAGNOSIS — R200 Anesthesia of skin: Secondary | ICD-10-CM | POA: Insufficient documentation

## 2019-08-12 DIAGNOSIS — R202 Paresthesia of skin: Secondary | ICD-10-CM

## 2019-08-12 HISTORY — DX: Anesthesia of skin: R20.0

## 2019-08-12 HISTORY — DX: Paresthesia of skin: R20.2

## 2019-08-12 HISTORY — DX: Otalgia, right ear: H92.01

## 2019-08-13 ENCOUNTER — Other Ambulatory Visit: Payer: Self-pay | Admitting: Otolaryngology

## 2019-08-13 DIAGNOSIS — R200 Anesthesia of skin: Secondary | ICD-10-CM

## 2019-08-13 DIAGNOSIS — H9201 Otalgia, right ear: Secondary | ICD-10-CM

## 2019-09-11 ENCOUNTER — Other Ambulatory Visit: Payer: Self-pay

## 2019-09-11 ENCOUNTER — Ambulatory Visit
Admission: RE | Admit: 2019-09-11 | Discharge: 2019-09-11 | Disposition: A | Discharge status: Home / Self Care | Payer: BC Managed Care – PPO | Source: Ambulatory Visit | Attending: Otolaryngology | Admitting: Otolaryngology

## 2019-09-11 DIAGNOSIS — R202 Paresthesia of skin: Secondary | ICD-10-CM

## 2019-09-11 DIAGNOSIS — H9201 Otalgia, right ear: Secondary | ICD-10-CM

## 2019-09-11 DIAGNOSIS — R200 Anesthesia of skin: Secondary | ICD-10-CM

## 2019-09-11 MED ORDER — GADOBENATE DIMEGLUMINE 529 MG/ML IV SOLN
20.0000 mL | Freq: Once | INTRAVENOUS | Status: AC | PRN
  Administered 2019-09-11: 20 mL via INTRAVENOUS

## 2019-10-16 ENCOUNTER — Other Ambulatory Visit: Payer: Self-pay

## 2019-10-16 ENCOUNTER — Emergency Department (HOSPITAL_COMMUNITY)
Admission: EM | Admit: 2019-10-16 | Discharge: 2019-10-16 | Disposition: A | Discharge status: Home / Self Care | Payer: BC Managed Care – PPO | Attending: Emergency Medicine | Admitting: Emergency Medicine

## 2019-10-16 ENCOUNTER — Encounter (HOSPITAL_COMMUNITY): Payer: Self-pay

## 2019-10-16 ENCOUNTER — Emergency Department (HOSPITAL_COMMUNITY): Payer: BC Managed Care – PPO

## 2019-10-16 DIAGNOSIS — R55 Syncope and collapse: Secondary | ICD-10-CM | POA: Insufficient documentation

## 2019-10-16 DIAGNOSIS — R1084 Generalized abdominal pain: Secondary | ICD-10-CM | POA: Insufficient documentation

## 2019-10-16 DIAGNOSIS — E1165 Type 2 diabetes mellitus with hyperglycemia: Secondary | ICD-10-CM | POA: Insufficient documentation

## 2019-10-16 DIAGNOSIS — Z5321 Procedure and treatment not carried out due to patient leaving prior to being seen by health care provider: Secondary | ICD-10-CM | POA: Diagnosis not present

## 2019-10-16 LAB — CBC
HCT: 35.8 % — ABNORMAL LOW (ref 39.0–52.0)
Hemoglobin: 11.4 g/dL — ABNORMAL LOW (ref 13.0–17.0)
MCH: 27.1 pg (ref 26.0–34.0)
MCHC: 31.8 g/dL (ref 30.0–36.0)
MCV: 85 fL (ref 80.0–100.0)
Platelets: 261 10*3/uL (ref 150–400)
RBC: 4.21 MIL/uL — ABNORMAL LOW (ref 4.22–5.81)
RDW: 14.9 % (ref 11.5–15.5)
WBC: 16.7 10*3/uL — ABNORMAL HIGH (ref 4.0–10.5)
nRBC: 0 % (ref 0.0–0.2)

## 2019-10-16 LAB — BASIC METABOLIC PANEL
Anion gap: 9 (ref 5–15)
BUN: 26 mg/dL — ABNORMAL HIGH (ref 8–23)
CO2: 22 mmol/L (ref 22–32)
Calcium: 9.4 mg/dL (ref 8.9–10.3)
Chloride: 107 mmol/L (ref 98–111)
Creatinine, Ser: 1.44 mg/dL — ABNORMAL HIGH (ref 0.61–1.24)
GFR calc Af Amer: 59 mL/min — ABNORMAL LOW (ref >60)
GFR calc non Af Amer: 51 mL/min — ABNORMAL LOW (ref >60)
Glucose, Bld: 165 mg/dL — ABNORMAL HIGH (ref 70–99)
Potassium: 4.4 mmol/L (ref 3.5–5.1)
Sodium: 138 mmol/L (ref 135–145)

## 2019-10-16 MED ORDER — SODIUM CHLORIDE 0.9% FLUSH: 3.0000 mL | Freq: Once | INTRAVENOUS | Status: DC

## 2019-10-16 NOTE — ED Triage Notes (Signed)
Patient reports that his BS has been fluctuating and this am had syncopal event and reports that BS was 180 when event occurred. On arrival alert and oriented. Complains of generalized pain from the fall.

## 2019-10-16 NOTE — ED Notes (Signed)
Pt reported to have left by lobby helper staff. Labels were retrieved.

## 2020-02-20 DIAGNOSIS — M7710 Lateral epicondylitis, unspecified elbow: Secondary | ICD-10-CM | POA: Insufficient documentation

## 2020-02-20 HISTORY — DX: Lateral epicondylitis, unspecified elbow: M77.10

## 2020-03-06 DIAGNOSIS — R42 Dizziness and giddiness: Secondary | ICD-10-CM | POA: Insufficient documentation

## 2020-03-06 HISTORY — DX: Dizziness and giddiness: R42

## 2020-04-21 ENCOUNTER — Encounter: Payer: Self-pay | Admitting: Neurology

## 2020-05-23 DIAGNOSIS — I2510 Atherosclerotic heart disease of native coronary artery without angina pectoris: Secondary | ICD-10-CM

## 2020-05-23 HISTORY — DX: Atherosclerotic heart disease of native coronary artery without angina pectoris: I25.10

## 2020-05-25 DIAGNOSIS — M1990 Unspecified osteoarthritis, unspecified site: Secondary | ICD-10-CM | POA: Insufficient documentation

## 2020-05-25 DIAGNOSIS — K219 Gastro-esophageal reflux disease without esophagitis: Secondary | ICD-10-CM | POA: Insufficient documentation

## 2020-05-25 DIAGNOSIS — E119 Type 2 diabetes mellitus without complications: Secondary | ICD-10-CM | POA: Insufficient documentation

## 2020-05-25 DIAGNOSIS — I10 Essential (primary) hypertension: Secondary | ICD-10-CM | POA: Insufficient documentation

## 2020-05-26 NOTE — Progress Notes (Signed)
Cardiology Office Note:    Date:  05/27/2020   ID:  Joshua Monroe, DOB [birth year], MRN [id number]  PCP:  Joshua Rua, MD  Cardiologist:  Joshua Herrlich, MD    Referring MD: Joshua Fellers, MD    ASSESSMENT:    1. Essential hypertension   2. Diabetes mellitus without complication (HCC)   3. Syncope, unspecified syncope type   4. Abnormal CXR    PLAN:    In order of problems listed above:  1. Poorly controlled add ARB he will contact me after 1 to 2 weeks if systolics remain greater than 140 I will see back in the office in about 6 weeks assess response.  We will need to check renal function 2 weeks after starting an ARB and he will continue his current beta-blocker 2. Stable continue with current therapy 3. No recurrence 4. His request refer back to pulmonary   Next appointment: 6 weeks   Medication Adjustments/Labs and Tests Ordered: Current medicines are reviewed at length with the patient today.  Concerns regarding medicines are outlined above.  No orders of the defined types were placed in this encounter.  No orders of the defined types were placed in this encounter.     History of Present Illness:    Joshua Monroe is a 65 y.o. male with a hx of diabetes, syncope due to hypoglycemia and abnormal chest x-ray with left pleural effusion and atelectasis last seen 10/16/2019.  Following that visit he underwent chest CT showing nodular scarring of both lung bases the left component was stable from the previous 2 years ago and there was a decrease in scarring on the right.  Compliance with diet, lifestyle and medications: Yes  He had no further episodes of hypoglycemia or syncope.  In interim he had vertigo forces him to stop his athletic activities.  He has gained about 10 pounds in his home blood pressures running 1 40-1 60 systolic.  No shortness of breath chest pain palpitation or syncope.  He needs follow-up with pulmonary referral is given and will initiate  antihypertensive therapy with an ARB with his diabetes and poorly controlled hypertension. Past Medical History:  Diagnosis Date  . Achilles tendon injury 11/20/2014  . Arthritis   . Diabetes mellitus due to underlying condition (HCC) 04/24/2017  . Diabetes mellitus without complication (HCC)   . Essential hypertension 04/24/2017  . GERD (gastroesophageal reflux disease)   . Hypertension   . Pneumonia 04/2018  . Syncope 04/24/2017    Past Surgical History:  Procedure Laterality Date  . ACHILLES TENDON SURGERY Left 11/20/2014   Procedure: OPEN REPAIR OF A LEFT ACHILLES TENDON RUPTURE;  Surgeon: Joshua Gosselin, MD;  Location: [facility];  Service: Orthopedics;  Laterality: Left;  . APPENDECTOMY    . CHOLECYSTECTOMY    . TONSILLECTOMY      Current Medications: Current Meds  Medication Sig  . atorvastatin (LIPITOR) 80 MG tablet Take 80 mg by mouth every evening.  . Insulin Human (INSULIN PUMP) SOLN Inject into the skin. Humalog  . Insulin Lispro-aabc (LYUMJEV) 100 UNIT/ML SOLN in insulin pump  . meloxicam (MOBIC) 15 MG tablet Take 15 mg by mouth daily.  . metFORMIN (GLUCOPHAGE) 1000 MG tablet Take 2 tablets by mouth every morning.  . metoprolol succinate (TOPROL-XL) 50 MG 24 hr tablet Take 50 mg by mouth daily.  . RABEprazole (ACIPHEX) 20 MG tablet Take 20 mg by mouth every morning.  . tobramycin (TOBREX) 0.3 % ophthalmic solution Place 1 drop into both  eyes daily as needed.  . TURMERIC PO Take 1,050 mg by mouth every morning.      Allergies:   Octreotide acetate, Other, and Simvastatin   Social History   Socioeconomic History  . Marital status: Married    Spouse name: Not on file  . Number of children: Not on file  . Years of education: Not on file  . Highest education level: Not on file  Occupational History  . Not on file  Tobacco Use  . Smoking status: Never Smoker  . Smokeless tobacco: Never Used  Vaping Use  . Vaping Use: Never used  Substance and Sexual Activity  .  Alcohol use: No  . Drug use: No  . Sexual activity: Not on file  Other Topics Concern  . Not on file  Social History Narrative  . Not on file   Social Determinants of Health   Financial Resource Strain: Not on file  Food Insecurity: Not on file  Transportation Needs: Not on file  Physical Activity: Not on file  Stress: Not on file  Social Connections: Not on file     Family History: The patient's family history includes Diabetes (age of onset: 40) in his mother; Healthy in his son. ROS:   Please see the history of present illness.    All other systems reviewed and are negative.  EKGs/Labs/Other Studies Reviewed:    The following studies were reviewed today:  EKG:  EKG ordered today and personally reviewed.  The ekg ordered today demonstrates sinus rhythm left axis deviation otherwise normal EKG  Recent Labs: 10/16/2019: BUN 26; Creatinine, Ser 1.44; Hemoglobin 11.4; Platelets 261; Potassium 4.4; Sodium 138  Recent Lipid Panel No results found for: CHOL, TRIG, HDL, CHOLHDL, VLDL, LDLCALC, LDLDIRECT  Physical Exam:    VS:  BP (!) 186/92   Pulse 68   Ht 6\' 1"  (1.854 m)   Wt 224 lb (101.6 kg)   SpO2 98%   BMI 29.55 kg/m     Wt Readings from Last 3 Encounters:  05/27/20 224 lb (101.6 kg)  07/07/18 207 lb (93.9 kg)  06/07/18 218 lb (98.9 kg)     GEN:  Well nourished, well developed in no acute distress HEENT: Normal NECK: No JVD; No carotid bruits LYMPHATICS: No lymphadenopathy CARDIAC: RRR, no murmurs, rubs, gallops RESPIRATORY:  Clear to auscultation without rales, wheezing or rhonchi  ABDOMEN: Soft, non-tender, non-distended MUSCULOSKELETAL:  No edema; No deformity  SKIN: Warm and dry NEUROLOGIC:  Alert and oriented x 3 PSYCHIATRIC:  Normal affect    Signed, 06/09/18, MD  05/27/2020 1:59 PM    [facility]

## 2020-05-27 ENCOUNTER — Other Ambulatory Visit: Payer: Self-pay

## 2020-05-27 ENCOUNTER — Encounter: Payer: Self-pay | Admitting: Cardiology

## 2020-05-27 ENCOUNTER — Ambulatory Visit: Payer: BC Managed Care – PPO | Admitting: Cardiology

## 2020-05-27 VITALS — BP 186/92 | HR 68 | Ht 73.0 in | Wt 224.0 lb

## 2020-05-27 DIAGNOSIS — I10 Essential (primary) hypertension: Secondary | ICD-10-CM

## 2020-05-27 DIAGNOSIS — E119 Type 2 diabetes mellitus without complications: Secondary | ICD-10-CM

## 2020-05-27 DIAGNOSIS — R9389 Abnormal findings on diagnostic imaging of other specified body structures: Secondary | ICD-10-CM

## 2020-05-27 DIAGNOSIS — R55 Syncope and collapse: Secondary | ICD-10-CM

## 2020-05-27 MED ORDER — LOSARTAN POTASSIUM 50 MG PO TABS: 50.0000 mg | ORAL_TABLET | Freq: Every day | ORAL | 3 refills | Status: DC

## 2020-05-27 NOTE — Patient Instructions (Signed)
Medication Instructions:  Your physician has recommended you make the following change in your medication:  START: Cozaar 50 mg take one tablet by mouth daily.  *If you need a refill on your cardiac medications before your next appointment, please call your pharmacy*   Lab Work: Your physician recommends that you return for lab work in: 2 WEEKS  BMP  If you have labs (blood work) drawn today and your tests are completely normal, you will receive your results only by: Marland Kitchen MyChart Message (if you have MyChart) OR . A paper copy in the mail If you have any lab test that is abnormal or we need to change your treatment, we will call you to review the results.   Testing/Procedures: None   Follow-Up: At [facility], you and your health needs are our priority.  As part of our continuing mission to provide you with exceptional heart care, we have created designated Provider Care Teams.  These Care Teams include your primary Cardiologist (physician) and Advanced Practice Providers (APPs -  Physician Assistants and Nurse Practitioners) who all work together to provide you with the care you need, when you need it.  We recommend signing up for the patient portal called "MyChart".  Sign up information is provided on this After Visit Summary.  MyChart is used to connect with patients for Virtual Visits (Telemedicine).  Patients are able to view lab/test results, encounter notes, upcoming appointments, etc.  Non-urgent messages can be sent to your provider as well.   To learn more about what you can do with MyChart, go to [email or web address].    Your next appointment:   6 week(s)  The format for your next appointment:   In Person  Provider:   Norman Herrlich, MD   Other Instructions

## 2020-06-02 ENCOUNTER — Telehealth: Payer: Self-pay

## 2020-06-02 LAB — BASIC METABOLIC PANEL
BUN/Creatinine Ratio: 14 (ref 10–24)
BUN: 18 mg/dL (ref 8–27)
CO2: 23 mmol/L (ref 20–29)
Calcium: 10 mg/dL (ref 8.6–10.2)
Chloride: 105 mmol/L (ref 96–106)
Creatinine, Ser: 1.3 mg/dL — ABNORMAL HIGH (ref 0.76–1.27)
GFR calc Af Amer: 67 mL/min/{1.73_m2} (ref >59)
GFR calc non Af Amer: 58 mL/min/{1.73_m2} — ABNORMAL LOW (ref >59)
Glucose: 68 mg/dL (ref 65–99)
Potassium: 4.3 mmol/L (ref 3.5–5.2)
Sodium: 143 mmol/L (ref 134–144)

## 2020-06-02 NOTE — Telephone Encounter (Signed)
Spoke with patient regarding results and recommendation.  Patient verbalizes understanding and is agreeable to plan of care. Advised patient to call back with any issues or concerns.  

## 2020-06-02 NOTE — Telephone Encounter (Signed)
Patient is returning call to discuss results. 

## 2020-06-02 NOTE — Telephone Encounter (Signed)
Left message on patients voicemail to please return our call.   

## 2020-06-02 NOTE — Telephone Encounter (Signed)
-----   Message from Baldo Daub, MD sent at 06/02/2020  8:54 AM EST ----- Stable result no changes

## 2020-06-03 ENCOUNTER — Telehealth: Payer: Self-pay | Admitting: Cardiology

## 2020-06-03 NOTE — Telephone Encounter (Signed)
Patient states he is returning a call from our office, no VM was left per patient. I don't see where anyone tried contacting him today but advised I would send a note stating he was returning a call and if someone was trying to reach him then they would reach back out.

## 2020-06-03 NOTE — Telephone Encounter (Signed)
Called patient informed him that we do not see any reason to call him and there are no messages in chart about trying to reach him. Made sure he wasn't waiting on a specific call regarding anything. He has no further questions.

## 2020-06-18 ENCOUNTER — Encounter: Payer: Self-pay | Admitting: Internal Medicine

## 2020-06-18 ENCOUNTER — Other Ambulatory Visit: Payer: Self-pay

## 2020-06-18 ENCOUNTER — Ambulatory Visit: Payer: Federal, State, Local not specified - PPO | Admitting: Internal Medicine

## 2020-06-18 DIAGNOSIS — J479 Bronchiectasis, uncomplicated: Secondary | ICD-10-CM

## 2020-06-18 DIAGNOSIS — I10 Essential (primary) hypertension: Secondary | ICD-10-CM

## 2020-06-18 HISTORY — DX: Bronchiectasis, uncomplicated: J47.9

## 2020-06-18 MED ORDER — FAMOTIDINE 20 MG PO TABS: ORAL_TABLET | ORAL | 11 refills | Status: DC

## 2020-06-18 NOTE — Assessment & Plan Note (Addendum)
See CT chest  12/19/2018 s/p prior pancreatic surgery in [place] around 2014 and assoc with severe chronic GERD (Buccini) -  HRCT ordered 06/18/2020   Discussed pathyophysiology of bronchiectasis and relationship to GERD so will focus on this first.  The good news is that despite wt gain since dx he still has good ex tolerance and really minimal intermittent symptoms typical of a never smoker with bronchiectasis.  >>> f/u p hrct          Each maintenance medication was reviewed in detail including emphasizing most importantly the difference between maintenance and prns and under what circumstances the prns are to be triggered using an action plan format where appropriate.  Total time for H and P, chart review, counseling, reviewing   and generating customized AVS unique to this new pt office visit / same day charting = 45 min

## 2020-06-18 NOTE — Patient Instructions (Addendum)
Continue Aciphex (rabezaprole) 20 mg Take 30-60 min before first meal of the day and pepcid 20 mg at least an hour before bed  Increase your losartan to 50 mg twice daily for a week and call Dr Dortha Kern for advice on 06/22/20 or 06/23/20 and avoid all salt and un-needed meloxicam or other arthritis meds (tylenol)   GERD (REFLUX)  is an extremely common cause of respiratory symptoms just like yours , many times with no obvious heartburn at all.    It can be treated with medication, but also with lifestyle changes including elevation of the head of your bed (ideally with 6 -8inch blocks under the headboard of your bed),  Smoking cessation, avoidance of late meals, excessive alcohol, and avoid fatty foods, chocolate, peppermint, colas, red wine, and acidic juices such as orange juice.  NO MINT OR MENTHOL PRODUCTS SO NO COUGH DROPS  USE SUGARLESS CANDY INSTEAD (Jolley ranchers or Stover's or Life Savers) or even ice chips will also do - the key is to swallow to prevent all throat clearing. NO OIL BASED VITAMINS - use powdered substitutes.  Avoid fish oil when coughing.   We will call to schedule you a CT without contrast at your convenience and call you with the results  Please schedule a follow up visit in 3 months but call sooner if needed

## 2020-06-18 NOTE — Progress Notes (Signed)
Joshua Monroe, male    DOB: Feb 26, 1956    MRN: [id number]   Brief patient profile:  10 yowm never smoker prev eval by Dr Kendrick Fries 05/2018:  06/07/2018:   shortness of breath and cough that is been persistent for about 2 months   He said the symptoms started insidiously without any sort of body aches, fevers or chills.  However he did have a significant amount of mucus production.  His symptoms worsened specifically the cough, mucus production and shortness of breath while he was traveling to [facility] and while there he had a chest x-ray performed which was read as markedly abnormal with a left hemidiaphragm and fluid in his left lung.  He was treated for pneumonia and influenza.  He followed up with his cardiologist who ordered a CT scan of his chest a couple weeks prior to OV   which does show an elevated left hemidiaphragm as well as some nodular densities along the left lung.     He says in the last week his cough has improved but he still has problems with cough when he lays on his left side.  He says that he is overall energy level and fatigue is increasing but he still has some shortness of breath when he plays tennis. rec Bronchiectasis: This is the medical term that means that you have an airway that is bigger and more dilated than it should be.  This makes you more susceptible to respiratory infections so in the event of cough, mucus production etc. you need to let us know right away so that we can prescribe an antibiotic If you have persistent daily chest congestion and mucus production then we will need to consider something called mucociliary clearance measures to help with this; at this time though I think your symptoms will continue to improve because you have a very mild area of bronchiectasis seen on the CT scan of your chest  Pleural thickening: This is the medical term that means that the lining of your lung is thicker and denser than it should be in the left base, this has  the appearance of a prior inflammatory or infectious process.  We need to keep a close eye on this and the nodular densities with a repeat CT scan in May 2020.  >>>  12/19/2018 : 7 mm nodule identified medial aspect of the posterior right costophrenic sulcus previously is now 5 mm (136/3). Subsegmental atelectasis noted right lower lobe. 2 mm posterior right upper lobe nodule (64/3) is new in the interval. Architectural distortion/scarring in the left base is similar to prior. No new suspicious nodule or mass in the left lung. No evidence for pleural effusion.      History of Present Illness  06/18/2020  Pulmonary/ 1st office eval/ Joshua Monroe / [facility]  Chief Complaint  Patient presents with  . Pulmonary Consult    Referred by Dr. Norman Herrlich for hx eval of abnormal chest xray. Pt states at times he has SOB esp when he lies down at night- better when he switches sides.   Dyspnea:  Able to play tennis again  Cough: most nights sev hours after hs sensation of cough > occ food comes up/ f/b  buccini  Sleep: flat bed bunch of pillows  SABA use: none   No obvious day to day or daytime variability or assoc excess/ purulent sputum or mucus plugs or hemoptysis or cp or chest tightness, subjective wheeze or overt sinus   symptoms.  Also denies any obvious fluctuation of symptoms with weather or environmental changes or other aggravating or alleviating factors except as outlined above   No unusual exposure hx or h/o childhood pna/ asthma or knowledge of premature birth.  Current Allergies, Complete Past Medical History, Past Surgical History, Family History, and Social History were reviewed in [employer] record.  ROS  The following are not active complaints unless bolded Hoarseness, sore throat, dysphagia, dental problems, itching, sneezing,  nasal congestion or discharge of excess mucus or purulent secretions, ear ache,   fever, chills, sweats, unintended wt loss  or wt gain, classically pleuritic or exertional cp,  orthopnea pnd or arm/hand swelling  or leg swelling, presyncope, palpitations, abdominal pain, anorexia, nausea, vomiting, diarrhea  or change in bowel habits or change in bladder habits, change in stools or change in urine, dysuria, hematuria,  rash, arthralgias, visual complaints, headache, numbness, weakness or ataxia or problems with walking or coordination,  change in mood or  memory.             Past Medical History:  Diagnosis Date  . Achilles tendon injury 11/20/2014  . Arthritis   . Diabetes mellitus due to underlying condition (HCC) 04/24/2017  . Diabetes mellitus without complication (HCC)   . Essential hypertension 04/24/2017  . GERD (gastroesophageal reflux disease)   . Hypertension   . Pneumonia 04/2018  . Syncope 04/24/2017    Outpatient Medications Prior to Visit  Medication Sig Dispense Refill  . atorvastatin (LIPITOR) 80 MG tablet Take 80 mg by mouth every evening.    . ferrous sulfate 325 (65 FE) MG tablet Take 325 mg by mouth daily with breakfast.    . Insulin Human (INSULIN PUMP) SOLN Inject into the skin. Humalog    . Insulin Lispro-aabc (LYUMJEV) 100 UNIT/ML SOLN in insulin pump    . losartan (COZAAR) 50 MG tablet Take 1 tablet (50 mg total) by mouth daily. 90 tablet 3  . metFORMIN (GLUCOPHAGE) 1000 MG tablet Take 2 tablets by mouth every morning.    . metoprolol succinate (TOPROL-XL) 50 MG 24 hr tablet Take 50 mg by mouth daily.    . Nutritional Supplements (IMMUNE ENHANCE) TABS Take 1 tablet by mouth daily.    . RABEprazole (ACIPHEX) 20 MG tablet Take 20 mg by mouth every morning.    . TURMERIC PO Take 1,050 mg by mouth every morning.     . meloxicam (MOBIC) 15 MG tablet Take 15 mg by mouth daily.    Marland Kitchen tobramycin (TOBREX) 0.3 % ophthalmic solution Place 1 drop into both eyes daily as needed.     No facility-administered medications prior to visit.     Objective:     BP (!) 188/92 (BP Location: Left Arm,  Cuff Size: Normal)   Pulse 74   Temp 97.6 F (36.4 C) (Temporal)   Ht 6\' 1"  (1.854 m)   Wt 220 lb (99.8 kg)   SpO2 99% Comment: on RA  BMI 29.03 kg/m   SpO2: 99 % (on RA)  - note bp as above    HEENT : pt wearing mask not removed for exam due to covid -19 concerns.    NECK :  without JVD/Nodes/TM/ nl carotid upstrokes bilaterally   LUNGS: no acc muscle use,  Nl contour chest which is clear to A and P bilaterally without cough on insp or exp maneuvers   CV:  RRR  no s3 or murmur or increase in P2, and no edema  ABD:  Protuberant but soft and nontender with nl inspiratory excursion in the supine position. No bruits or organomegaly appreciated, bowel sounds nl  MS:  Nl gait/ ext warm without deformities, calf tenderness, cyanosis or clubbing No obvious joint restrictions   SKIN: warm and dry without lesions    NEURO:  alert, approp, nl sensorium with  no motor or cerebellar deficits apparent.          Assessment   Bronchiectasis without complication (HCC) See CT chest  12/19/2018 s/p prior pancreatic surgery in [place] around 2014 and assoc with severe chronic GERD (Buccini) -  HRCT ordered 06/18/2020   Discussed pathyophysiology of bronchiectasis and relationship to GERD so will focus on this first.  The good news is that despite wt gain since dx he still has good ex tolerance and really minimal intermittent symptoms typical of a never smoker with bronchiectasis.  >>> f/u p hrct       Essential hypertension Not optimally controlled on present regimen. I reviewed this with the patient and emphasized importance of follow-up with primary care/cards and in meantime ok to increase losartan to 50 mg bid         Each maintenance medication was reviewed in detail including emphasizing most importantly the difference between maintenance and prns and under what circumstances the prns are to be triggered using an action plan format where appropriate.  Total time for H and P,  chart review, counseling, reviewing   and generating customized AVS unique to this new pt office visit / same day charting = 45 min    Sandrea Hughs, MD 06/18/2020

## 2020-06-18 NOTE — Assessment & Plan Note (Signed)
Not optimally controlled on present regimen. I reviewed this with the patient and emphasized importance of follow-up with primary care/cards and in meantime ok to increase losartan to 50 mg bid

## 2020-06-22 ENCOUNTER — Telehealth: Payer: Self-pay

## 2020-06-22 ENCOUNTER — Telehealth: Payer: Self-pay | Admitting: Cardiology

## 2020-06-22 MED ORDER — AMLODIPINE BESYLATE 5 MG PO TABS: 5.0000 mg | ORAL_TABLET | Freq: Every day | ORAL | 3 refills | Status: DC

## 2020-06-22 MED ORDER — LOSARTAN POTASSIUM 100 MG PO TABS: 100.0000 mg | ORAL_TABLET | Freq: Two times a day (BID) | ORAL | 3 refills | Status: DC

## 2020-06-22 NOTE — Telephone Encounter (Signed)
Per Dr. Dulce Sellar: One half equals 50 mg twice a day.   -I faxed this information to the pharmacy to clarify the directions per [facility] request.

## 2020-06-22 NOTE — Telephone Encounter (Signed)
Spoke to the patient just now and let him know Dr. Munley's recommendations. He verbalizes understanding and thanks me for the call back.  

## 2020-06-22 NOTE — Telephone Encounter (Signed)
Message sent to Dr. Dulce Sellar: Good evening. Can you clarify when can the patient take 100mg  of Losartan bid. I know you said 100 qd but the it states he could take bid.

## 2020-06-22 NOTE — Telephone Encounter (Signed)
He obviously has fairly severe hypertension  Increase Cozaar 100 mg daily he could take it twice daily  Add amlodipine 5 mg daily  Directions as last time checking BPs letting us know if his systolics remain greater than 140 in 2 weeks

## 2020-06-22 NOTE — Telephone Encounter (Signed)
Pt c/o BP issue: STAT if pt c/o blurred vision, one-sided weakness or slurred speech  1. What are your last 5 BP readings?   1/28-  178/83-Morning   173/88- 11:30am   162/73 -3:30pm  1/29-  165/83 -6:30am  190/91-11:30am  183/73-5:30pm   1/30-  185/82  -3:00 am  165/91 -8:30 am  167/91 3:30 pm  187/85 8pm 1/31-  189/87- 8:30 am    2. Are you having any other symptoms (ex. Dizziness, headache, blurred vision, passed out)?  Headache ,  Pt denies any other symptoms   3. What is your BP issue? Pt has been running high   Best number [phone]

## 2020-07-13 ENCOUNTER — Other Ambulatory Visit: Payer: Self-pay

## 2020-07-13 ENCOUNTER — Ambulatory Visit: Payer: Federal, State, Local not specified - PPO | Admitting: Neurology

## 2020-07-13 ENCOUNTER — Encounter: Payer: Self-pay | Admitting: Neurology

## 2020-07-13 VITALS — BP 152/80 | HR 79 | Ht 73.0 in | Wt 222.0 lb

## 2020-07-13 DIAGNOSIS — G501 Atypical facial pain: Secondary | ICD-10-CM | POA: Diagnosis not present

## 2020-07-13 MED ORDER — GABAPENTIN 300 MG PO CAPS: ORAL_CAPSULE | ORAL | 3 refills | Status: DC

## 2020-07-13 NOTE — Progress Notes (Signed)
[facility] Clinic Note - Initial Visit   Date: 07/13/20  Platon Arocho MRN: [id number] DOB: February 25, 1956   Dear Dr. Sharl Ma:  Thank you for your kind referral of Joshua Monroe for consultation of facial pain. Although his history is well known to you, please allow [place] to reiterate it for the purpose of our medical record. The patient was accompanied to the clinic by self.    History of Present Illness: Joshua Monroe is a 65 y.o. 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   For the past year, he has achy, dull pain over the right jaw which radiates towards his right eye.  Pain is present most of the time, which is worse after eating and laying on the right ear.  He has not noticed that NSAIDs help alleviate discomfort.   He also has some numbness/tingling over the same area.  He occasionally has sharp pain.  Pain is not triggered by chewing, brushing the teeth, or talking. He denies episodic, lacinating/electrical pain. He has previously seen ENT and had MRI brain wwo contrast which was unremarkable.  He is a retired Optician, dispensing.    Past Medical History:  Diagnosis Date  . Achilles tendon injury 11/20/2014  . Arthritis   . Diabetes mellitus due to underlying condition (HCC) 04/24/2017  . Diabetes mellitus without complication (HCC)   . Essential hypertension 04/24/2017  . GERD (gastroesophageal reflux disease)   . Hypertension   . Pneumonia 04/2018  . Syncope 04/24/2017    Past Surgical History:  Procedure Laterality Date  . ACHILLES TENDON SURGERY Left 11/20/2014   Procedure: OPEN REPAIR OF A LEFT ACHILLES TENDON RUPTURE;  Surgeon: Ranee Gosselin, MD;  Location: [facility];  Service: Orthopedics;  Laterality: Left;  . APPENDECTOMY    . CHOLECYSTECTOMY    . TONSILLECTOMY       Medications:  Outpatient Encounter Medications as of  07/13/2020  Medication Sig Note  . amLODipine (NORVASC) 5 MG tablet Take 1 tablet (5 mg total) by mouth daily.   Marland Kitchen atorvastatin (LIPITOR) 80 MG tablet Take 80 mg by mouth every evening.   . Insulin Human (INSULIN PUMP) SOLN Inject into the skin. Humalog 11/20/2014: Insulin pump set at 2 units/hr Humalog insulin  . Insulin Lispro-aabc (LYUMJEV) 100 UNIT/ML SOLN in insulin pump   . losartan (COZAAR) 100 MG tablet Take 1 tablet (100 mg total) by mouth 2 (two) times daily.   . metFORMIN (GLUCOPHAGE) 1000 MG tablet Take 2 tablets by mouth every morning.   . metoprolol succinate (TOPROL-XL) 50 MG 24 hr tablet Take 50 mg by mouth daily.   . Nutritional Supplements (IMMUNE ENHANCE) TABS Take 1 tablet by mouth daily.   . RABEprazole (ACIPHEX) 20 MG tablet Take 20 mg by mouth every morning.   . TURMERIC PO Take 1,050 mg by mouth every morning.    . famotidine (PEPCID) 20 MG tablet One after supper (Patient not taking: Reported on 07/13/2020)   . ferrous sulfate 325 (65 FE) MG tablet Take 325 mg by mouth daily with breakfast. (Patient not taking: Reported on 07/13/2020)    No facility-administered encounter medications on file as of 07/13/2020.    Allergies:  Allergies  Allergen Reactions  . Octreotide Acetate Other (See Comments)    Pain and tightness in muscle   . Other Other (See Comments)    Sandostatin --Pain and tightness in muscle  . Simvastatin Other (See  Comments)    ATTACK MUSCLES    Family History: Family History  Problem Relation Age of Onset  . Diabetes Mother 30       AGE 71 DIED WITH DIBETIC COMA  . Healthy Son     Social History: Social History   Tobacco Use  . Smoking status: Never Smoker  . Smokeless tobacco: Never Used  Vaping Use  . Vaping Use: Never used  Substance Use Topics  . Alcohol use: No  . Drug use: No   Social History   Social History Narrative   Right Handed    Lives in a one story home    Drinks tea    Vital Signs:  BP (!) 152/80   Pulse 79    Ht 6\' 1"  (1.854 m)   Wt 222 lb (100.7 kg)   SpO2 99%   BMI 29.29 kg/m   Neurological Exam: MENTAL STATUS including orientation to time, place, person, recent and remote memory, attention span and concentration, language, and fund of knowledge is normal.  Speech is not dysarthric.  CRANIAL NERVES: II:  No visual field defects.   III-IV-VI: Pupils equal round and reactive to light.  Normal conjugate, extra-ocular eye movements in all directions of gaze.  No nystagmus.  No ptosis.   V:  Normal facial sensation.    VII:  Normal facial symmetry and movements.  There is tenderness with palpation over the right upper jaw/cheek region. VIII:  Normal hearing and vestibular function.   IX-X:  Normal palatal movement.   XI:  Normal shoulder shrug and head rotation.   XII:  Normal tongue strength and range of motion, no deviation or fasciculation.  MOTOR:  No atrophy, fasciculations or abnormal movements.  No pronator drift.   Upper Extremity:  Right  Left  Deltoid  5/5   5/5   Biceps  5/5   5/5   Triceps  5/5   5/5   Infraspinatus 5/5  5/5  Medial pectoralis 5/5  5/5  Wrist extensors  5/5   5/5   Wrist flexors  5/5   5/5   Finger extensors  5/5   5/5   Finger flexors  5/5   5/5   Dorsal interossei  5/5   5/5   Abductor pollicis  5/5   5/5   Tone (Ashworth scale)  0  0   Lower Extremity:  Right  Left  Hip flexors  5/5   5/5   Hip extensors  5/5   5/5   Adductor 5/5  5/5  Abductor 5/5  5/5  Knee flexors  5/5   5/5   Knee extensors  5/5   5/5   Dorsiflexors  5/5   5/5   Plantarflexors  5/5   5/5   Toe extensors  5/5   5/5   Toe flexors  5/5   5/5   Tone (Ashworth scale)  0  0   MSRs:  Right        Left                  brachioradialis 2+  2+  biceps 2+  2+  triceps 2+  2+  patellar 2+  2+  ankle jerk 2+  2+  Hoffman no  no  plantar response down  down   SENSORY:  Normal and symmetric perception of light touch, pinprick, vibration, and proprioception.  Romberg's sign  absent.   COORDINATION/GAIT: Normal finger-to- nose-finger and heel-to-shin.  Intact rapid alternating movements  bilaterally.  Able to rise from a chair without using arms.  Gait narrow based and stable. Tandem and stressed gait intact.   DATA: MRI brain wwo contrast 09/11/2019: 1. No acute intracranial abnormality or mass. 2. Mild-to-moderate chronic small vessel ischemic disease. 3. Small right mastoid effusion.   IMPRESSION: Atypical right facial pain.  Symptomology is not classic for trigeminal neuralgia. MRI brain was personally viewed and shows age-related changes, nothing which would explain his facial pain.  The fact that he gets some relief with NSAIDs and has pain with palpation over the cheek/jaw suggests more of musculoskeletal pain.   I discussed a trial of medication and he is agreeable to starting gabapentin 300mg  at bedtime x 1 week, then increase to 300mg  BID.  Return to clinic in 3 months.    Thank you for allowing me to participate in patient's care.  If I can answer any additional questions, I would be pleased to do so.    Sincerely,    Paizley Ramella K. , DO

## 2020-07-13 NOTE — Patient Instructions (Addendum)
Start multivitamin daily  Start gabapentin 300mg  at bedtime x 1 week, then increase to 1 tablet twice daily  Return to clinic in 3 months

## 2020-07-17 ENCOUNTER — Ambulatory Visit (HOSPITAL_COMMUNITY)
Admission: RE | Admit: 2020-07-17 | Discharge: 2020-07-17 | Disposition: A | Discharge status: Home / Self Care | Payer: Federal, State, Local not specified - PPO | Source: Ambulatory Visit | Attending: Internal Medicine | Admitting: Internal Medicine

## 2020-07-17 ENCOUNTER — Other Ambulatory Visit: Payer: Self-pay

## 2020-07-17 DIAGNOSIS — J479 Bronchiectasis, uncomplicated: Secondary | ICD-10-CM | POA: Insufficient documentation

## 2020-07-18 NOTE — Progress Notes (Deleted)
Cardiology Office Note:    Date:  07/18/2020   ID:  Joshua Monroe, DOB [birth year], MRN [id number]  PCP:  Joycelyn Rua, MD  Cardiologist:  Norman Herrlich, MD    Referring MD: Joycelyn Rua, MD    ASSESSMENT:    No diagnosis found. PLAN:    In order of problems listed above:  1. ***   Next appointment: ***   Medication Adjustments/Labs and Tests Ordered: Current medicines are reviewed at length with the patient today.  Concerns regarding medicines are outlined above.  No orders of the defined types were placed in this encounter.  No orders of the defined types were placed in this encounter.   No chief complaint on file.   History of Present Illness:    Joshua Monroe is a 65 y.o. male with a hx of syncope hypertension type 2 diabetes mellitus and abnormal chest x-ray and CT scan showing left pleural effusion and nodular scarring of both lung bases last seen 05/27/2020 poorly controlled hypertension started on ARB. Compliance with diet, lifestyle and medications: *** Past Medical History:  Diagnosis Date  . Achilles tendon injury 11/20/2014  . Arthritis   . Diabetes mellitus due to underlying condition (HCC) 04/24/2017  . Diabetes mellitus without complication (HCC)   . Essential hypertension 04/24/2017  . GERD (gastroesophageal reflux disease)   . Hypertension   . Pneumonia 04/2018  . Syncope 04/24/2017    Past Surgical History:  Procedure Laterality Date  . ACHILLES TENDON SURGERY Left 11/20/2014   Procedure: OPEN REPAIR OF A LEFT ACHILLES TENDON RUPTURE;  Surgeon: Ranee Gosselin, MD;  Location: [facility];  Service: Orthopedics;  Laterality: Left;  . APPENDECTOMY    . CHOLECYSTECTOMY    . TONSILLECTOMY      Current Medications: No outpatient medications have been marked as taking for the 07/20/20 encounter (Appointment) with Baldo Daub, MD.     Allergies:   Octreotide acetate, Other, and Simvastatin   Social History   Socioeconomic History  .  Marital status: Married    Spouse name: Not on file  . Number of children: 3  . Years of education: Not on file  . Highest education level: Not on file  Occupational History  . Not on file  Tobacco Use  . Smoking status: Never Smoker  . Smokeless tobacco: Never Used  Vaping Use  . Vaping Use: Never used  Substance and Sexual Activity  . Alcohol use: No  . Drug use: No  . Sexual activity: Not on file  Other Topics Concern  . Not on file  Social History Narrative   Right Handed    Lives in a one story home    Drinks tea   Social Determinants of Health   Financial Resource Strain: Not on file  Food Insecurity: Not on file  Transportation Needs: Not on file  Physical Activity: Not on file  Stress: Not on file  Social Connections: Not on file     Family History: The patient's ***family history includes Diabetes (age of onset: 38) in his mother; Healthy in his son. ROS:   Please see the history of present illness.    All other systems reviewed and are negative.  EKGs/Labs/Other Studies Reviewed:    The following studies were reviewed today:  EKG:  EKG ordered today and personally reviewed.  The ekg ordered today demonstrates ***  Recent Labs: 10/16/2019: Hemoglobin 11.4; Platelets 261 06/01/2020: BUN 18; Creatinine, Ser 1.30; Potassium 4.3; Sodium 143  Recent Lipid Panel No  results found for: CHOL, TRIG, HDL, CHOLHDL, VLDL, LDLCALC, LDLDIRECT  Physical Exam:    VS:  There were no vitals taken for this visit.    Wt Readings from Last 3 Encounters:  07/13/20 222 lb (100.7 kg)  06/18/20 220 lb (99.8 kg)  05/27/20 224 lb (101.6 kg)     GEN: *** Well nourished, well developed in no acute distress HEENT: Normal NECK: No JVD; No carotid bruits LYMPHATICS: No lymphadenopathy CARDIAC: ***RRR, no murmurs, rubs, gallops RESPIRATORY:  Clear to auscultation without rales, wheezing or rhonchi  ABDOMEN: Soft, non-tender, non-distended MUSCULOSKELETAL:  No edema; No  deformity  SKIN: Warm and dry NEUROLOGIC:  Alert and oriented x 3 PSYCHIATRIC:  Normal affect    Signed, Norman Herrlich, MD  07/18/2020 7:21 PM    [facility]

## 2020-07-20 ENCOUNTER — Ambulatory Visit: Payer: Federal, State, Local not specified - PPO | Admitting: Cardiology

## 2020-07-23 ENCOUNTER — Encounter: Payer: Self-pay | Admitting: Cardiology

## 2020-07-23 ENCOUNTER — Ambulatory Visit: Payer: Federal, State, Local not specified - PPO | Admitting: Cardiology

## 2020-07-23 ENCOUNTER — Telehealth: Payer: Self-pay | Admitting: Internal Medicine

## 2020-07-23 ENCOUNTER — Other Ambulatory Visit: Payer: Self-pay

## 2020-07-23 VITALS — BP 154/74 | HR 68 | Ht 73.0 in | Wt 218.1 lb

## 2020-07-23 DIAGNOSIS — E119 Type 2 diabetes mellitus without complications: Secondary | ICD-10-CM

## 2020-07-23 DIAGNOSIS — I10 Essential (primary) hypertension: Secondary | ICD-10-CM | POA: Diagnosis not present

## 2020-07-23 DIAGNOSIS — J479 Bronchiectasis, uncomplicated: Secondary | ICD-10-CM | POA: Diagnosis not present

## 2020-07-23 MED ORDER — CARVEDILOL 6.25 MG PO TABS: 6.2500 mg | ORAL_TABLET | Freq: Two times a day (BID) | ORAL | 3 refills | Status: DC

## 2020-07-23 NOTE — Patient Instructions (Signed)
Medication Instructions:  Your physician has recommended you make the following change in your medication:   Stop Metoprolol Start Carvedilol 6.25 mg twice daily.  Call or send a message in MyChart I 2 weeks.  *If you need a refill on your cardiac medications before your next appointment, please call your pharmacy*   Lab Work: None ordered If you have labs (blood work) drawn today and your tests are completely normal, you will receive your results only by: Marland Kitchen MyChart Message (if you have MyChart) OR . A paper copy in the mail If you have any lab test that is abnormal or we need to change your treatment, we will call you to review the results.   Testing/Procedures: None ordered   Follow-Up: At [facility], you and your health needs are our priority.  As part of our continuing mission to provide you with exceptional heart care, we have created designated Provider Care Teams.  These Care Teams include your primary Cardiologist (physician) and Advanced Practice Providers (APPs -  Physician Assistants and Nurse Practitioners) who all work together to provide you with the care you need, when you need it.  We recommend signing up for the patient portal called "MyChart".  Sign up information is provided on this After Visit Summary.  MyChart is used to connect with patients for Virtual Visits (Telemedicine).  Patients are able to view lab/test results, encounter notes, upcoming appointments, etc.  Non-urgent messages can be sent to your provider as well.   To learn more about what you can do with MyChart, go to [email or web address].    Your next appointment:   6 month(s)  The format for your next appointment:   In Person  Provider:   Norman Herrlich, MD   Other Instructions Carvedilol Tablets What is this medicine? CARVEDILOL (KAR ve dil ol) is a beta blocker. It decreases the amount of work your heart has to do and helps your heart beat regularly. It treats high blood  pressure. This medicine may be used for other purposes; ask your health care provider or pharmacist if you have questions. COMMON BRAND NAME(S): Coreg What should I tell my health care provider before I take this medicine? They need to know if you have any of these conditions:  circulation problems  diabetes  history of heart attack or heart disease  liver disease  lung or breathing disease, like asthma or emphysema  pheochromocytoma  slow or irregular heartbeat  thyroid disease  an unusual or allergic reaction to carvedilol, other beta-blockers, medicines, foods, dyes, or preservatives  pregnant or trying to get pregnant  breast-feeding How should I use this medicine? Take this drug by mouth. Take it as directed on the prescription label at the same time every day. Take it with food. Keep taking it unless your health care provider tells you to stop. Talk to your health care provider about the use of this drug in children. Special care may be needed. Overdosage: If you think you have taken too much of this medicine contact a poison control center or emergency room at once. NOTE: This medicine is only for you. Do not share this medicine with others. What if I miss a dose? If you miss a dose, take it as soon as you can. If it is almost time for your next dose, take only that dose. Do not take double or extra doses. What may interact with this medicine? This medicine may interact with the following medications:  certain medicines for blood pressure,  heart disease, irregular heart beat  certain medicines for depression, like fluoxetine or paroxetine  certain medicines for diabetes, like glipizide or glyburide  cimetidine  clonidine  cyclosporine  digoxin  MAOIs like Carbex, Eldepryl, Marplan, Nardil, and Parnate  reserpine  rifampin This list may not describe all possible interactions. Give your health care provider a list of all the medicines, herbs,  non-prescription drugs, or dietary supplements you use. Also tell them if you smoke, drink alcohol, or use illegal drugs. Some items may interact with your medicine. What should I watch for while using this medicine? Check your heart rate and blood pressure regularly while you are taking this medicine. Ask your doctor or health care professional what your heart rate and blood pressure should be, and when you should contact him or her. Do not stop taking this medicine suddenly. This could lead to serious heart-related effects. Contact your doctor or health care professional if you have difficulty breathing while taking this drug. Check your weight daily. Ask your doctor or health care professional when you should notify him/her of any weight gain. You may get drowsy or dizzy. Do not drive, use machinery, or do anything that requires mental alertness until you know how this medicine affects you. To reduce the risk of dizzy or fainting spells, do not sit or stand up quickly. Alcohol can make you more drowsy, and increase flushing and rapid heartbeats. Avoid alcoholic drinks. This medicine may increase blood sugar. Ask your healthcare provider if changes in diet or medicines are needed if you have diabetes. If you are going to have surgery, tell your doctor or health care professional that you are taking this medicine. What side effects may I notice from receiving this medicine? Side effects that you should report to your doctor or health care professional as soon as possible:  allergic reactions like skin rash, itching or hives, swelling of the face, lips, or tongue  breathing problems  dark urine  irregular heartbeat   signs and symptoms of high blood sugar such as being more thirsty or hungry or having to urinate more than normal. You may also feel very tired or have blurry vision.  swollen legs or ankles  vomiting  yellowing of the eyes or skin Side effects that usually do not require  medical attention (report to your doctor or health care professional if they continue or are bothersome):  change in sex drive or performance  diarrhea  dry eyes (especially if wearing contact lenses)  dry, itching skin  headache  nausea  unusually tired This list may not describe all possible side effects. Call your doctor for medical advice about side effects. You may report side effects to FDA at 1-800-FDA-1088. Where should I keep my medicine? Keep out of the reach of children and pets. Store at room temperature between 20 and 25 degrees C (68 and 77 degrees F). Protect from moisture. Keep the container tightly closed. Throw away any unused drug after the expiration date. NOTE: This sheet is a summary. It may not cover all possible information. If you have questions about this medicine, talk to your doctor, pharmacist, or health care provider.  2021 Elsevier/Gold Standard (2018-12-14 17:42:09)

## 2020-07-23 NOTE — Telephone Encounter (Signed)
Call returned to patient, confirmed DOB. Made aware of CT results per MW:     Voiced understanding. Aware a recall was placed for his f/u appt.   Nothing further needed at this time.

## 2020-07-23 NOTE — Progress Notes (Signed)
LMTCB

## 2020-07-23 NOTE — Progress Notes (Signed)
Cardiology Office Note:    Date:  07/23/2020   ID:  Joshua Monroe, DOB Oct 19, 1955, MRN [id number]  PCP:  Joycelyn Rua, MD  Cardiologist:  Norman Herrlich, MD    Referring MD: Joycelyn Rua, MD    ASSESSMENT:    1. Essential hypertension   2. Diabetes mellitus without complication (HCC)   3. Bronchiectasis without complication (HCC)    PLAN:    In order of problems listed above:  1. Remains above target transition metoprolol carvedilol for potent antihypertensive effect and send me a my chart message in 2 weeks and remains above target we will transition from losartan to high potency ARB like telmisartan.  Continue calcium channel blocker. 2. Stable managed by his PCP on insulin 3. Recently evaluated by pulmonary stable asymptomatic 4. Stable hyperlipidemia continue with statin   Next appointment: 6 months   Medication Adjustments/Labs and Tests Ordered: Current medicines are reviewed at length with the patient today.  Concerns regarding medicines are outlined above.  No orders of the defined types were placed in this encounter.  No orders of the defined types were placed in this encounter.   No chief complaint on file.   History of Present Illness:    Joshua Monroe is a 65 y.o. male with a hx of diabetes with syncope due to hypoglycemia abnormal chest x-ray left pleural effusion and atelectasis and poorly controlled hypertension last seen 05/27/2020.  Compliance with diet, lifestyle and medications: Yes  Blood pressure is improved increase his ARB but remains greater than 150 systolic over 70s.  Otherwise well no exercise intolerance edema shortness of breath chest pain palpitation syncope or recurrent hypoglycemia.  He relates he has had labs done to follow-up on lipids and his last A1c is 7.4%  Following that visit he was seen in pulmonary consultation felt to have stable bronchiectasis without complication. Past Medical History:  Diagnosis Date  . Achilles  tendon injury 11/20/2014  . Arthritis   . Diabetes mellitus due to underlying condition (HCC) 04/24/2017  . Diabetes mellitus without complication (HCC)   . Essential hypertension 04/24/2017  . GERD (gastroesophageal reflux disease)   . Hypertension   . Pneumonia 04/2018  . Syncope 04/24/2017    Past Surgical History:  Procedure Laterality Date  . ACHILLES TENDON SURGERY Left 11/20/2014   Procedure: OPEN REPAIR OF A LEFT ACHILLES TENDON RUPTURE;  Surgeon: Ranee Gosselin, MD;  Location: [facility];  Service: Orthopedics;  Laterality: Left;  . APPENDECTOMY    . CHOLECYSTECTOMY    . TONSILLECTOMY      Current Medications: Current Meds  Medication Sig  . amLODipine (NORVASC) 5 MG tablet Take 1 tablet (5 mg total) by mouth daily.  Marland Kitchen atorvastatin (LIPITOR) 80 MG tablet Take 80 mg by mouth every evening.  . gabapentin (NEURONTIN) 300 MG capsule Take 1 tablet at bedtime x 1 week, then increase to 1 tablet twice daily.  . Insulin Lispro-aabc (LYUMJEV) 100 UNIT/ML SOLN in insulin pump  . losartan (COZAAR) 100 MG tablet Take 1 tablet (100 mg total) by mouth 2 (two) times daily.  . metFORMIN (GLUCOPHAGE) 1000 MG tablet Take 2 tablets by mouth every morning.  . metoprolol succinate (TOPROL-XL) 50 MG 24 hr tablet Take 50 mg by mouth daily.  . Nutritional Supplements (IMMUNE ENHANCE) TABS Take 1 tablet by mouth daily.  . RABEprazole (ACIPHEX) 20 MG tablet Take 20 mg by mouth every morning.  . TURMERIC PO Take 1,050 mg by mouth every morning.   . [DISCONTINUED] Insulin  Human (INSULIN PUMP) SOLN Inject into the skin. Humalog     Allergies:   Octreotide acetate, Other, Sandostatin [octreotide], Lisinopril, and Simvastatin   Social History   Socioeconomic History  . Marital status: Married    Spouse name: Not on file  . Number of children: 3  . Years of education: Not on file  . Highest education level: Not on file  Occupational History  . Not on file  Tobacco Use  . Smoking status: Never Smoker   . Smokeless tobacco: Never Used  Vaping Use  . Vaping Use: Never used  Substance and Sexual Activity  . Alcohol use: No  . Drug use: No  . Sexual activity: Not on file  Other Topics Concern  . Not on file  Social History Narrative   Right Handed    Lives in a one story home    Drinks tea   Social Determinants of Health   Financial Resource Strain: Not on file  Food Insecurity: Not on file  Transportation Needs: Not on file  Physical Activity: Not on file  Stress: Not on file  Social Connections: Not on file     Family History: The patient's family history includes Diabetes (age of onset: 4) in his mother; Healthy in his son. ROS:   Please see the history of present illness.    All other systems reviewed and are negative.  EKGs/Labs/Other Studies Reviewed:    The following studies were reviewed today:  Recent Labs: 10/16/2019: Hemoglobin 11.4; Platelets 261 06/01/2020: BUN 18; Creatinine, Ser 1.30; Potassium 4.3; Sodium 143  Recent Lipid Panel No results found for: CHOL, TRIG, HDL, CHOLHDL, VLDL, LDLCALC, LDLDIRECT  Physical Exam:    VS:  BP (!) 154/74   Pulse 68   Ht 6\' 1"  (1.854 m)   Wt 218 lb 1.3 oz (98.9 kg)   SpO2 97%   BMI 28.77 kg/m     Wt Readings from Last 3 Encounters:  07/23/20 218 lb 1.3 oz (98.9 kg)  07/13/20 222 lb (100.7 kg)  06/18/20 220 lb (99.8 kg)     GEN:  Well nourished, well developed in no acute distress HEENT: Normal NECK: No JVD; No carotid bruits LYMPHATICS: No lymphadenopathy CARDIAC: RRR, no murmurs, rubs, gallops RESPIRATORY:  Clear to auscultation without rales, wheezing or rhonchi  ABDOMEN: Soft, non-tender, non-distended MUSCULOSKELETAL:  No edema; No deformity  SKIN: Warm and dry NEUROLOGIC:  Alert and oriented x 3 PSYCHIATRIC:  Normal affect    Signed, 06/20/20, MD  07/23/2020 11:18 AM    [facility]

## 2020-07-30 NOTE — Progress Notes (Signed)
Pt has been notified of results per phone note 07/23/20

## 2020-10-12 ENCOUNTER — Ambulatory Visit: Payer: Federal, State, Local not specified - PPO | Admitting: Neurology

## 2020-11-09 ENCOUNTER — Ambulatory Visit: Payer: Federal, State, Local not specified - PPO | Admitting: Internal Medicine

## 2020-11-09 ENCOUNTER — Other Ambulatory Visit: Payer: Self-pay

## 2020-11-09 ENCOUNTER — Encounter: Payer: Self-pay | Admitting: Internal Medicine

## 2020-11-09 DIAGNOSIS — J479 Bronchiectasis, uncomplicated: Secondary | ICD-10-CM

## 2020-11-09 NOTE — Progress Notes (Signed)
Joshua Monroe, male    DOB: 10/14/1955    MRN: [id number]   Brief patient profile:  61 yowm never smoker prev eval by Dr Kendrick Fries 05/2018:  06/07/2018:   shortness of breath and cough that is been persistent for about 2 months   He said the symptoms started insidiously without any sort of body aches, fevers or chills.  However he did have a significant amount of mucus production.  His symptoms worsened specifically the cough, mucus production and shortness of breath while he was traveling to [facility] and while there he had a chest x-ray performed which was read as markedly abnormal with a left hemidiaphragm and fluid in his left lung.  He was treated for pneumonia and influenza.  He followed up with his cardiologist who ordered a CT scan of his chest a couple weeks prior to OV   which does show an elevated left hemidiaphragm as well as some nodular densities along the left lung.     He says in the last week his cough has improved but he still has problems with cough when he lays on his left side.  He says that he is overall energy level and fatigue is increasing but he still has some shortness of breath when he plays tennis. rec Bronchiectasis: This is the medical term that means that you have an airway that is bigger and more dilated than it should be.  This makes you more susceptible to respiratory infections so in the event of cough, mucus production etc. you need to let us know right away so that we can prescribe an antibiotic If you have persistent daily chest congestion and mucus production then we will need to consider something called mucociliary clearance measures to help with this; at this time though I think your symptoms will continue to improve because you have a very mild area of bronchiectasis seen on the CT scan of your chest  Pleural thickening: This is the medical term that means that the lining of your lung is thicker and denser than it should be in the left base, this has  the appearance of a prior inflammatory or infectious process.  We need to keep a close eye on this and the nodular densities with a repeat CT scan in May 2020.  >>>  12/19/2018 : 7 mm nodule identified medial aspect of the posterior right costophrenic sulcus previously is now 5 mm (136/3). Subsegmental atelectasis noted right lower lobe. 2 mm posterior right upper lobe nodule (64/3) is new in the interval. Architectural distortion/scarring in the left base is similar to prior. No new suspicious nodule or mass in the left lung. No evidence for pleural effusion.      History of Present Illness  06/18/2020  Pulmonary/ 1st office eval/ Larry Knipp / [place] [facility]  Chief Complaint  Patient presents with   Pulmonary Consult    Referred by Dr. Norman Herrlich for hx eval of abnormal chest xray. Pt states at times he has SOB esp when he lies down at night- better when he switches sides.   Dyspnea:  Able to play tennis again  Cough: most nights sev hours after hs sensation of cough > occ food comes up/ f/b  buccini  Sleep: flat bed bunch of pillows  SABA use: none   Rec Continue Aciphex (rabezaprole) 20 mg Take 30-60 min before first meal of the day and pepcid 20 mg at least an hour before bed Increase your losartan to 50 mg twice  daily for a week and call Dr Dortha Kern for advice on 06/22/20 or 06/23/20 and avoid all salt and un-needed meloxicam or other arthritis meds (tylenol)  GERD diet We will call to schedule you a CT without contrast at your convenience and call you with the results > bronchiectasis    11/09/2020  f/u ov/[facility] re: bronchiectasis / overt HB f/b Buccini on just ppi in am  Chief Complaint  Patient presents with   Follow-up    Breathing is doing well and no new co's today.   Dyspnea:  Not limited by breathing from desired activities  / tennis fine  Cough: not now - cold lasted for months  Sleeping:  wakes up with burning more often than not using pillows for HOB  elevation as didn't find bed blocks as helpful SABA use: none  02: none  Covid status: vax x 3  Lung cancer screening: never smoker   No obvious day to day or daytime variability or assoc excess/ purulent sputum or mucus plugs or hemoptysis or cp or chest tightness, subjective wheeze or overt sinus symptoms.    Also denies any obvious fluctuation of symptoms with weather or environmental changes or other aggravating or alleviating factors except as outlined above   No unusual exposure hx or h/o childhood pna/ asthma or knowledge of premature birth.  Current Allergies, Complete Past Medical History, Past Surgical History, Family History, and Social History were reviewed in [employer] record.  ROS  The following are not active complaints unless bolded Hoarseness, sore throat, dysphagia, dental problems, itching, sneezing,  nasal congestion or discharge of excess mucus or purulent secretions, ear ache,   fever, chills, sweats, unintended wt loss or wt gain, classically pleuritic or exertional cp,  orthopnea pnd or arm/hand swelling  or leg swelling, presyncope, palpitations, abdominal pain, anorexia, nausea, vomiting, diarrhea  or change in bowel habits or change in bladder habits, change in stools or change in urine, dysuria, hematuria,  rash, arthralgias, visual complaints, headache, numbness, weakness or ataxia or problems with walking or coordination,  change in mood or  memory.        Current Meds  Medication Sig   atorvastatin (LIPITOR) 80 MG tablet Take 80 mg by mouth every evening.   carvedilol (COREG) 6.25 MG tablet Take 1 tablet (6.25 mg total) by mouth 2 (two) times daily.   gabapentin (NEURONTIN) 300 MG capsule Take 1 tablet at bedtime x 1 week, then increase to 1 tablet twice daily.   Insulin Lispro-aabc (LYUMJEV) 100 UNIT/ML SOLN in insulin pump   metFORMIN (GLUCOPHAGE) 1000 MG tablet Take 2 tablets by mouth every morning.   Nutritional Supplements (IMMUNE  ENHANCE) TABS Take 1 tablet by mouth daily.   RABEprazole (ACIPHEX) 20 MG tablet Take 20 mg by mouth every morning.   TURMERIC PO Take 1,050 mg by mouth every morning.              Past Medical History:  Diagnosis Date   Achilles tendon injury 11/20/2014   Arthritis    Diabetes mellitus due to underlying condition (HCC) 04/24/2017   Diabetes mellitus without complication (HCC)    Essential hypertension 04/24/2017   GERD (gastroesophageal reflux disease)    Hypertension    Pneumonia 04/2018   Syncope 04/24/2017        Objective:     Wt Readings from Last 3 Encounters:  11/09/20 222 lb (100.7 kg)  07/23/20 218 lb 1.3 oz (98.9 kg)  07/13/20 222 lb (  100.7 kg)      Vital signs reviewed  11/09/2020  - Note at rest 02 sats  97% on RA   General appearance:    amb wm nad   HEENT : pt wearing mask not removed for exam due to covid -19 concerns.    NECK :  without JVD/Nodes/TM/ nl carotid upstrokes bilaterally   LUNGS: no acc muscle use,  Nl contour chest which is clear to A and P bilaterally without cough on insp or exp maneuvers   CV:  RRR  no s3 or murmur or increase in P2, and no edema   ABD:  obese soft and nontender with nl inspiratory excursion in the supine position. No bruits or organomegaly appreciated, bowel sounds nl  MS:  Nl gait/ ext warm without deformities, calf tenderness, cyanosis or clubbing No obvious joint restrictions   SKIN: warm and dry without lesions    NEURO:  alert, approp, nl sensorium with  no motor or cerebellar deficits apparent.               Assessment

## 2020-11-09 NOTE — Assessment & Plan Note (Addendum)
See CT chest  12/19/2018 s/p prior pancreatic surgery in [place] around 2014 and assoc with severe chronic GERD (Buccini) -  HRCT  07/17/20 1. Scattered areas of mild cylindrical bronchiectasis and what appear to be areas of post infectious or inflammatory scarring, most evident in the inferior segment of the lingula, lateral aspect of the left lower lobe and right middle lobe, as above. 2. Aortic atherosclerosis, in addition to 3 vessel coronary artery disease.   Relationship to GERD reviewed - not really bothered by cough or limiting sob at present so no further w/u planned other than focus on GERD rx and add pepcid 20 mg p suppper to [facility] until sees GI   Reviewed pathophysiology and rx options > none needed now so f/u is prn          Each maintenance medication was reviewed in detail including emphasizing most importantly the difference between maintenance and prns and under what circumstances the prns are to be triggered using an action plan format where appropriate.  Total time for H and P, chart review, counseling,  and generating customized AVS unique to this office visit / same day charting = 28 min

## 2020-11-09 NOTE — Patient Instructions (Addendum)
Bronchiectasis =   you have scarring of your bronchial tubes which means that they don't function perfectly normally and mucus tends to pool in certain areas of your lung which can cause pneumonia and further scarring of your lung and bronchial tubes  Whenever you develop cough congestion take mucinex or mucinex dm (1200 mg every 12 hours) > these will help keep the mucus loose and flowing but if your condition worsens you need to seek help immediately preferably here or somewhere inside the [facility] system to compare xrays ( worse = darker or bloody mucus or pain on breathing in)   Add Pepcid 20mg  after supper or an hour or so before bedtime   GERD (REFLUX)  is an extremely common cause of respiratory symptoms just like yours , many times with no obvious heartburn at all.    It can be treated with medication, but also with lifestyle changes including elevation of the head of your bed (ideally with 6 -8inch blocks under the headboard of your bed  10 degrees of elevation chest higher than abdomen),  Smoking cessation, avoidance of late meals, excessive alcohol, and avoid fatty foods, chocolate, peppermint, colas, red wine, and acidic juices such as orange juice.  NO MINT OR MENTHOL PRODUCTS SO NO COUGH DROPS  USE SUGARLESS CANDY INSTEAD (Jolley ranchers or Stover's or Life Savers) or even ice chips will also do - the key is to swallow to prevent all throat clearing. NO OIL BASED VITAMINS - use powdered substitutes.  Avoid fish oil when coughing.    I strongly recommend you follow up with Dr     If you are satisfied with your treatment plan,  let your doctor know and he/she can either refill your medications or you can return here when your prescription runs out.     If in any way you are not 100% satisfied,  please tell Matthias Hughs.  If 100% better, tell your friends!  Pulmonary follow up is as needed       Pulmonary f/u is as needed

## 2021-01-19 ENCOUNTER — Other Ambulatory Visit: Payer: Self-pay

## 2021-01-19 ENCOUNTER — Encounter: Payer: Self-pay | Admitting: Cardiology

## 2021-01-19 ENCOUNTER — Ambulatory Visit (INDEPENDENT_AMBULATORY_CARE_PROVIDER_SITE_OTHER): Payer: Federal, State, Local not specified - PPO | Admitting: Cardiology

## 2021-01-19 VITALS — BP 166/76 | HR 66 | Ht 74.0 in | Wt 226.0 lb

## 2021-01-19 DIAGNOSIS — I10 Essential (primary) hypertension: Secondary | ICD-10-CM | POA: Diagnosis not present

## 2021-01-19 MED ORDER — TELMISARTAN 40 MG PO TABS: 40.0000 mg | ORAL_TABLET | Freq: Two times a day (BID) | ORAL | 3 refills | Status: DC

## 2021-01-19 NOTE — Progress Notes (Signed)
Cardiology Office Note:    Date:  01/19/2021   ID:  Joshua Monroe, DOB 19-Apr-1956, MRN [id number]  PCP:  Joycelyn Rua, MD  Cardiologist:  Norman Herrlich, MD    Referring MD: Joycelyn Rua, MD    ASSESSMENT:    1. Essential hypertension    PLAN:    In order of problems listed above:  His blood pressure remains above target on a multidrug regimen including calcium channel blocker alpha beta-blocker carvedilol and ARB, we will transition from low intensity losartan to high intensity my card is within expectation of systolics following an additional 10 mmHg he will trend his blood pressures and send me a MyChart message in 2 to 3 weeks.  If remains above goal low-dose clonidine can be beneficial as an additional antihypertensive agent on low doses and usually well-tolerated.  The other alternative is whether to give him diuretic either thiazide or low-dose MRA.   Next appointment: 6 months   Medication Adjustments/Labs and Tests Ordered: Current medicines are reviewed at length with the patient today.  Concerns regarding medicines are outlined above.  No orders of the defined types were placed in this encounter.  Meds ordered this encounter  Medications   telmisartan (MICARDIS) 40 MG tablet    Sig: Take 1 tablet (40 mg total) by mouth in the morning and at bedtime.    Dispense:  180 tablet    Refill:  3    Chief Complaint  Patient presents with   Follow-up   Hypertension    History of Present Illness:    Joshua Monroe is a 65 y.o. male with a hx of type 2 diabetes with syncope due to hypoglycemia bronchiectasis and hypertension last seen 07/23/2020 with poorly controlled hypertension he was transition metoprolol to carvedilol for antihypertensive effect I advised a more potent ARB he declined and continued on losartan.  Blood pressure now is running typically 140-150/80.  He does high-level activity every day walking the dog equestrian playing tennis and has no angina  shortness of breath palpitation or syncope. Compliance with diet, lifestyle and medications:  Past Medical History:  Diagnosis Date   Achilles tendon injury 11/20/2014   Arthritis    Diabetes mellitus due to underlying condition (HCC) 04/24/2017   Diabetes mellitus without complication (HCC)    Essential hypertension 04/24/2017   GERD (gastroesophageal reflux disease)    Hypertension    Pneumonia 04/2018   Syncope 04/24/2017    Past Surgical History:  Procedure Laterality Date   ACHILLES TENDON SURGERY Left 11/20/2014   Procedure: OPEN REPAIR OF A LEFT ACHILLES TENDON RUPTURE;  Surgeon: Ranee Gosselin, MD;  Location: [facility];  Service: Orthopedics;  Laterality: Left;   APPENDECTOMY     CHOLECYSTECTOMY     TONSILLECTOMY      Current Medications: Current Meds  Medication Sig   amLODipine (NORVASC) 5 MG tablet Take 1 tablet (5 mg total) by mouth daily.   atorvastatin (LIPITOR) 80 MG tablet Take 80 mg by mouth every evening.   carvedilol (COREG) 6.25 MG tablet Take 1 tablet (6.25 mg total) by mouth 2 (two) times daily.   gabapentin (NEURONTIN) 300 MG capsule Take 1 tablet at bedtime x 1 week, then increase to 1 tablet twice daily.   Insulin Lispro-aabc (LYUMJEV) 100 UNIT/ML SOLN in insulin pump   metFORMIN (GLUCOPHAGE) 1000 MG tablet Take 2 tablets by mouth every morning.   Nutritional Supplements (IMMUNE ENHANCE) TABS Take 1 tablet by mouth daily.   RABEprazole (ACIPHEX) 20 MG tablet  Take 20 mg by mouth every morning.   telmisartan (MICARDIS) 40 MG tablet Take 1 tablet (40 mg total) by mouth in the morning and at bedtime.   TURMERIC PO Take 1,050 mg by mouth every morning.    [DISCONTINUED] losartan (COZAAR) 100 MG tablet Take 1 tablet (100 mg total) by mouth 2 (two) times daily.     Allergies:   Octreotide acetate, Other, Sandostatin [octreotide], Lisinopril, and Simvastatin   Social History   Socioeconomic History   Marital status: Married    Spouse name: Not on file   Number  of children: 3   Years of education: Not on file   Highest education level: Not on file  Occupational History   Not on file  Tobacco Use   Smoking status: Never   Smokeless tobacco: Never  Vaping Use   Vaping Use: Never used  Substance and Sexual Activity   Alcohol use: No   Drug use: No   Sexual activity: Not on file  Other Topics Concern   Not on file  Social History Narrative   Right Handed    Lives in a one story home    Drinks tea   Social Determinants of Health   Financial Resource Strain: Not on file  Food Insecurity: Not on file  Transportation Needs: Not on file  Physical Activity: Not on file  Stress: Not on file  Social Connections: Not on file     Family History: The patient's family history includes Diabetes (age of onset: 44) in his mother; Healthy in his son. ROS:   Please see the history of present illness.    All other systems reviewed and are negative.  EKGs/Labs/Other Studies Reviewed:    The following studies were reviewed today:   Recent Labs: 06/01/2020: BUN 18; Creatinine, Ser 1.30; Potassium 4.3; Sodium 143  Recent Lipid Panel No results found for: CHOL, TRIG, HDL, CHOLHDL, VLDL, LDLCALC, LDLDIRECT  Physical Exam:    VS:  BP (!) 166/76 (BP Location: Left Arm, Patient Position: Sitting)   Pulse 66   Ht 6\' 2"  (1.88 m)   Wt 226 lb (102.5 kg)   SpO2 98%   BMI 29.02 kg/m     Wt Readings from Last 3 Encounters:  01/19/21 226 lb (102.5 kg)  11/09/20 222 lb (100.7 kg)  07/23/20 218 lb 1.3 oz (98.9 kg)     GEN:  Well nourished, well developed in no acute distress HEENT: Normal NECK: No JVD; No carotid bruits LYMPHATICS: No lymphadenopathy CARDIAC: RRR, no murmurs, rubs, gallops RESPIRATORY:  Clear to auscultation without rales, wheezing or rhonchi  ABDOMEN: Soft, non-tender, non-distended MUSCULOSKELETAL:  No edema; No deformity  SKIN: Warm and dry NEUROLOGIC:  Alert and oriented x 3 PSYCHIATRIC:  Normal affect     Signed, 09/22/20, MD  01/19/2021 3:08 PM    [facility]

## 2021-01-19 NOTE — Patient Instructions (Signed)
Medication Instructions:  Your physician has recommended you make the following change in your medication:  START: Telmisartan 40 mg take one tablet by mouth twice daily. Reduce this to once daily if your blood pressure is less than 130 on top consistently.  STOP: Losartan  *If you need a refill on your cardiac medications before your next appointment, please call your pharmacy*   Lab Work: None If you have labs (blood work) drawn today and your tests are completely normal, you will receive your results only by: MyChart Message (if you have MyChart) OR A paper copy in the mail If you have any lab test that is abnormal or we need to change your treatment, we will call you to review the results.   Testing/Procedures: None   Follow-Up: At [facility], you and your health needs are our priority.  As part of our continuing mission to provide you with exceptional heart care, we have created designated Provider Care Teams.  These Care Teams include your primary Cardiologist (physician) and Advanced Practice Providers (APPs -  Physician Assistants and Nurse Practitioners) who all work together to provide you with the care you need, when you need it.  We recommend signing up for the patient portal called "MyChart".  Sign up information is provided on this After Visit Summary.  MyChart is used to connect with patients for Virtual Visits (Telemedicine).  Patients are able to view lab/test results, encounter notes, upcoming appointments, etc.  Non-urgent messages can be sent to your provider as well.   To learn more about what you can do with MyChart, go to [email or web address].    Your next appointment:   6 month(s)  The format for your next appointment:   In Person  Provider:   Norman Herrlich, MD   Other Instructions

## 2021-01-21 ENCOUNTER — Ambulatory Visit: Payer: Federal, State, Local not specified - PPO | Admitting: Cardiology

## 2021-02-22 ENCOUNTER — Encounter: Payer: Self-pay | Admitting: Podiatry

## 2021-02-22 ENCOUNTER — Other Ambulatory Visit: Payer: Self-pay

## 2021-02-22 ENCOUNTER — Ambulatory Visit (INDEPENDENT_AMBULATORY_CARE_PROVIDER_SITE_OTHER): Payer: Federal, State, Local not specified - PPO | Admitting: Podiatry

## 2021-02-22 DIAGNOSIS — R238 Other skin changes: Secondary | ICD-10-CM | POA: Insufficient documentation

## 2021-02-22 HISTORY — DX: Other skin changes: R23.8

## 2021-02-22 NOTE — Progress Notes (Signed)
This patient presents to the office for evaluation of healing blisters on the bottom of both forefeet.  He says he developed blood blisters 4 weeks ago.  He says he has been caring for these blisters at home.  He was seen by Dr.  Sharl Ma who referred him to this office.  He now has dried blisters noted under the forefeet  both feet.  He has no pain or drainage at these sites.  He presents to the office for continued evaluation and treatment. Patient is diabetic and is taking gabapentin.  Vascular  Dorsalis pedis and posterior tibial pulses are weakly  palpable  B/L.  Capillary return  WNL.  Temperature gradient is  WNL.  Skin turgor  WNL  Sensorium  Senn Weinstein monofilament wire diminished.  . Diminished tactile sensation.  Nail Exam  Patient has normal nails with no evidence of bacterial or fungal infection.  Orthopedic  Exam  Muscle tone and muscle strength  WNL.  No limitations of motion feet  B/L.  No crepitus or joint effusion noted.  Mild  HAV  B/l with plantar flexed 2,3  B/L.  Skin  No open lesions.  Normal skin texture and turgor. Brown skin covering over previous site of blister. No redness or drainage.  Healing blister sib 2,3  B/L.  IE.  Diabetic foot exam performed.  Padding dispensed to cushion site of the blisters.  Told patient to make an appoinment for diabetic insoles with the lab.  Patient would benefit for with cut-outs  on the orthoses  B/l.   Helane Gunther DPM

## 2021-02-25 ENCOUNTER — Other Ambulatory Visit: Payer: Federal, State, Local not specified - PPO

## 2021-04-09 ENCOUNTER — Other Ambulatory Visit: Payer: Self-pay | Admitting: Cardiology

## 2021-06-12 IMAGING — CT CT HEAD W/O CM
3 of 5 series · 15 of 47 positions shown, 18 images · non-contrast
Comparison: MRI 09/11/2019

CLINICAL DATA: Syncopal event

EXAM:
CT HEAD WITHOUT CONTRAST
TECHNIQUE: Contiguous axial images were obtained from the base of the skull
through the vertex without intravenous contrast.

[Series 2: head 5.0 h30s · axial · 0.44mm/px · z∈[-30,+85]mm · 9 of 29 slices shown, 12 images]
[im 3/29  brain]
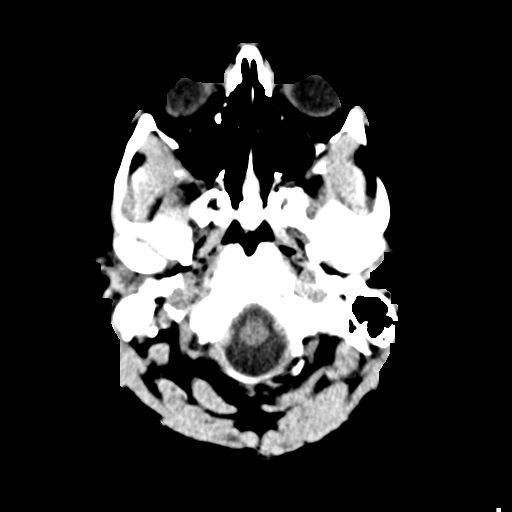
[im 3/29  bone]
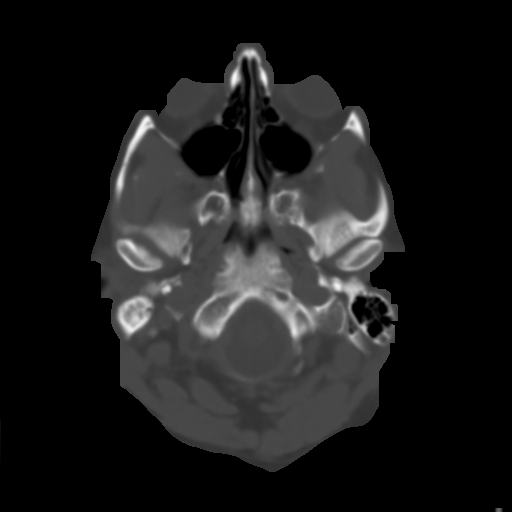
[im 5/29  brain]
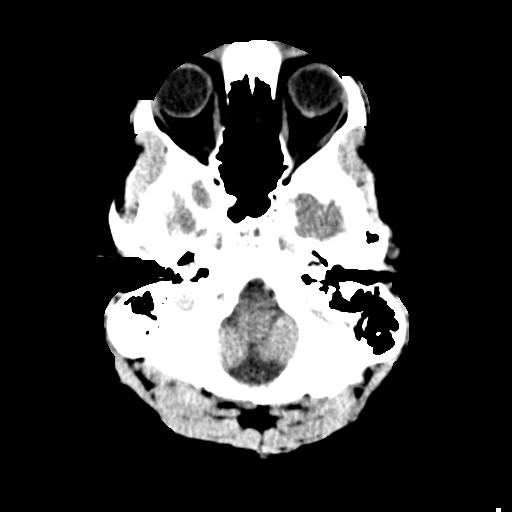
[im 10/29  brain]
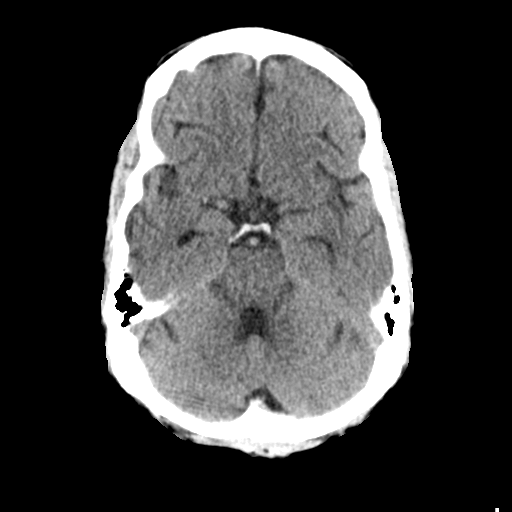
[im 12/29  brain]
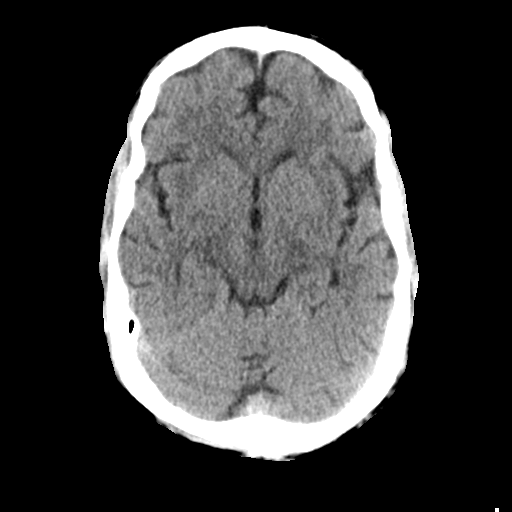
[im 15/29  brain]
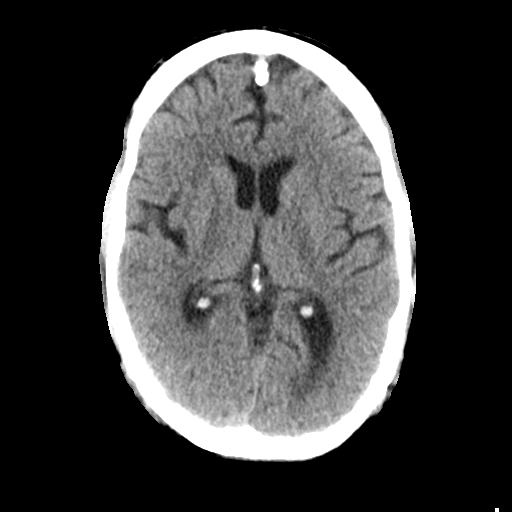
[im 15/29  bone]
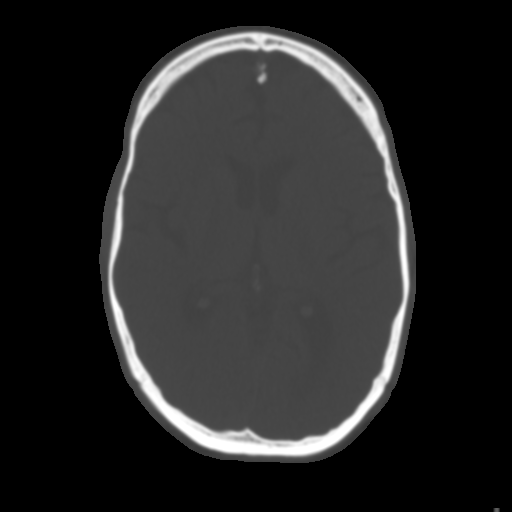
[im 17/29  brain]
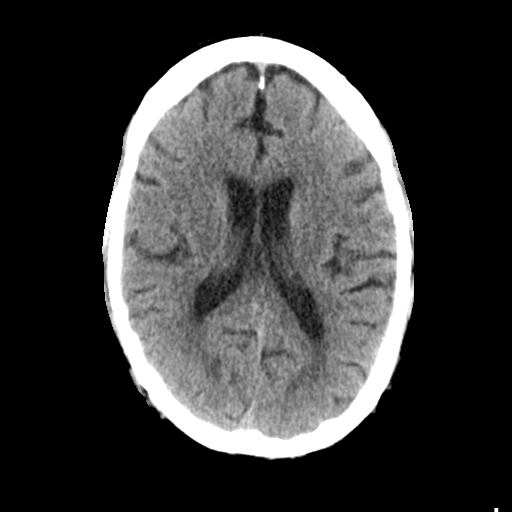
[im 19/29  brain]
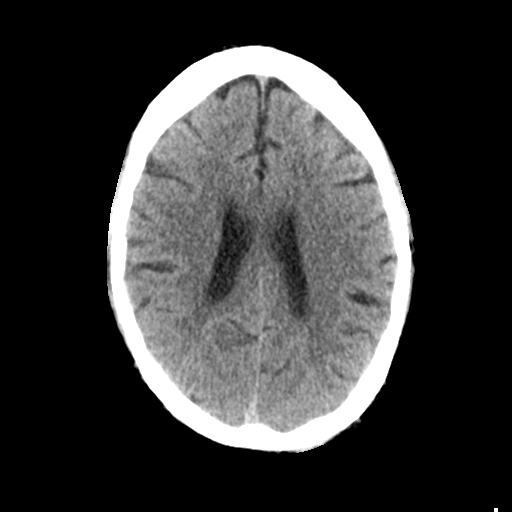
[im 24/29  brain]
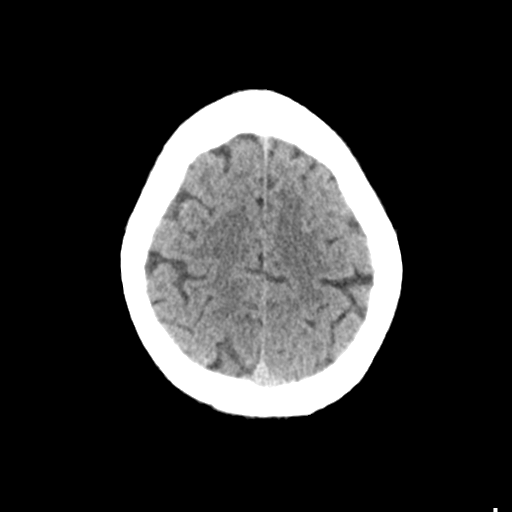
[im 26/29  brain]
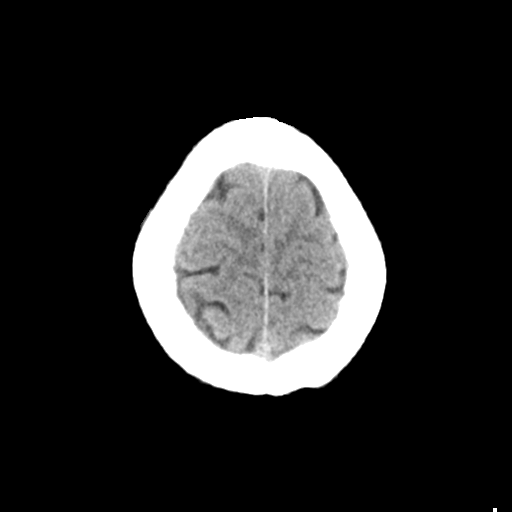
[im 26/29  bone]
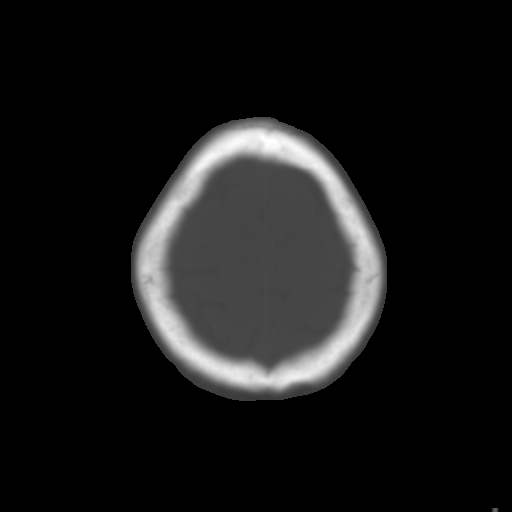

[Series 4: head 3.0 mpr cor · coronal · 0.33mm/px · 3 of 76 slices shown]
[im 26/76  brain]
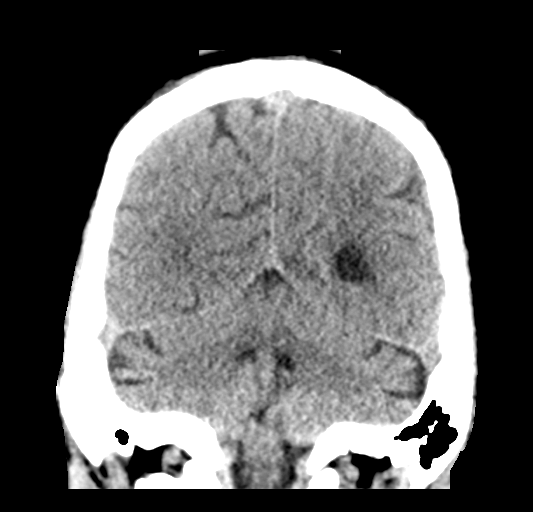
[im 34/76  brain]
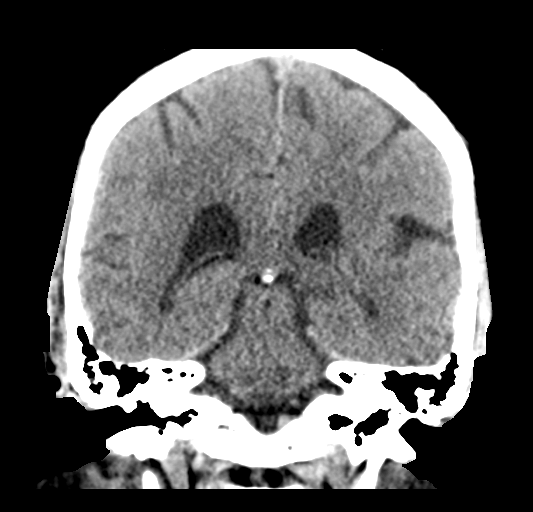
[im 42/76  brain]
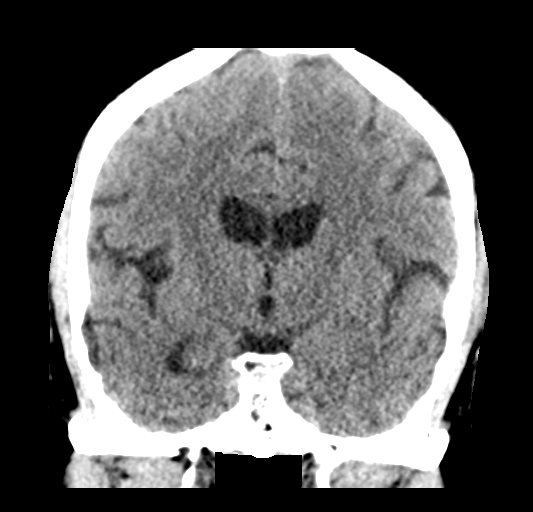

[Series 5: head 3.0 mpr sag · sagittal · 0.32mm/px · 3 of 59 slices shown]
[im 20/59  brain]
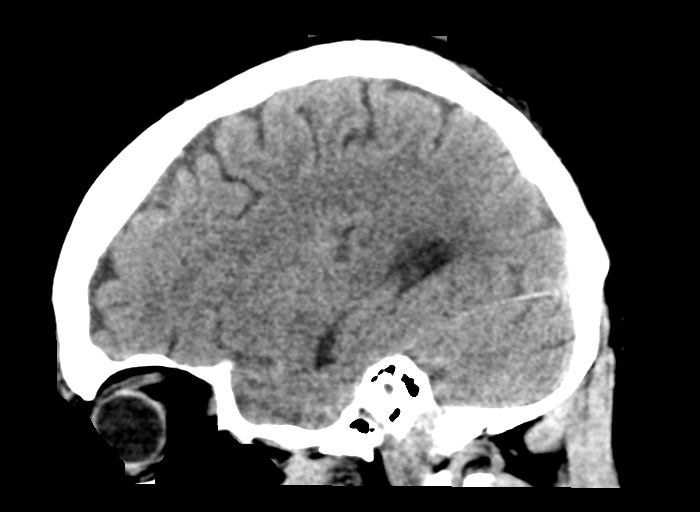
[im 30/59  brain]
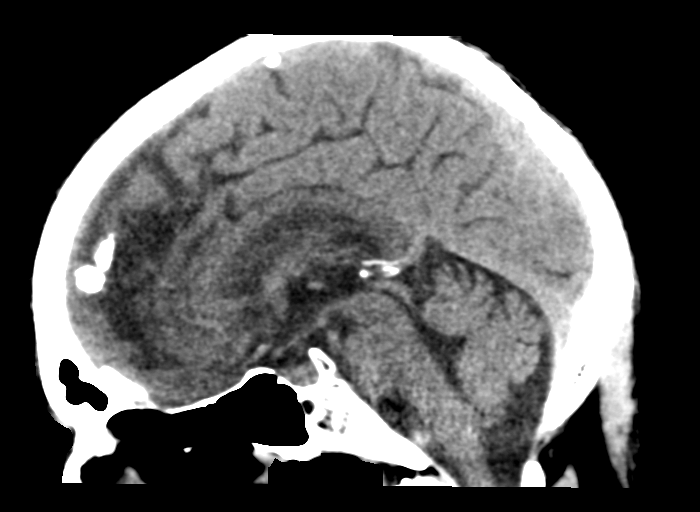
[im 39/59  brain]
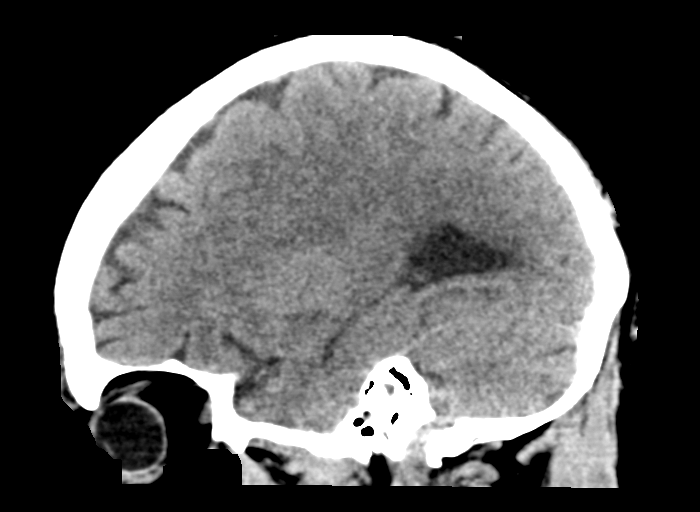

[15 of 47 positions shown; findings below may reference images not displayed]

FINDINGS: Brain: Cranial vertex is incompletely included. No acute territorial
infarction, hemorrhage, or mass is visualized. Mild atrophy.
Nonenlarged ventricles

Vascular: No hyperdense vessels.  Vascular calcification

Skull: Normal. Negative for fracture or focal lesion. Small fluid in
the right mastoid.

Sinuses/Orbits: No acute finding.

Other: None
IMPRESSION: 1. No definite CT evidence for acute intracranial abnormality. Note
that the extreme cranial vertex is incompletely included.
2. Mild atrophy.

## 2021-06-21 ENCOUNTER — Other Ambulatory Visit: Payer: Self-pay | Admitting: Internal Medicine

## 2021-07-12 ENCOUNTER — Other Ambulatory Visit: Payer: Self-pay | Admitting: Cardiology

## 2021-07-20 ENCOUNTER — Other Ambulatory Visit: Payer: Self-pay | Admitting: Internal Medicine

## 2021-08-02 ENCOUNTER — Ambulatory Visit: Payer: Federal, State, Local not specified - PPO | Admitting: Cardiology

## 2021-08-18 ENCOUNTER — Other Ambulatory Visit: Payer: Self-pay | Admitting: Internal Medicine

## 2021-08-30 ENCOUNTER — Other Ambulatory Visit (HOSPITAL_COMMUNITY): Payer: Federal, State, Local not specified - PPO

## 2021-08-30 NOTE — Patient Instructions (Addendum)
2 VISITORS ARE  ALLOWED TO COME WITH YOU AND STAY IN THE WAITING ROOM ONLY DURING PRE OP AND PROCEDURE.   ? ?**NO VISITORS ARE ALLOWED IN THE SHORT STAY AREA OR RECOVERY ROOM!!** ? ?IF YOU WILL BE ADMITTED INTO THE HOSPITAL YOU ARE ALLOWED ONLY TWO SUPPORT PEOPLE DURING VISITATION HOURS ONLY (7 AM -8PM)   ?The support person(s) must pass our screening, gel in and out, and wear a mask at all times, including in the patient?s room. ?Patients must also wear a mask when staff or their support person are in the room. ?Visitors GUEST BADGE MUST BE WORN VISIBLY  ?One adult visitor may remain with you overnight and MUST be in the room by 8 P.M. ?  ? ? Your procedure is scheduled on: 09/28/21 ? ? Report to [facility] Main Entrance ? ?  Report to short stay at 5:15 AM ? ? Call this number if you have problems the morning of surgery [phone] ? ? Do not eat food or drink :After Midnight. ? ? ?           If you have questions, please contact your surgeon?s office. ? ? ?FOLLOW BOWEL PREP AND ANY ADDITIONAL PRE OP INSTRUCTIONS YOU RECEIVED FROM YOUR SURGEON'S OFFICE!!! ?  ?  ?Oral Hygiene is also important to reduce your risk of infection.                                    ?Remember - BRUSH YOUR TEETH THE MORNING OF SURGERY WITH YOUR REGULAR TOOTHPASTE ? ? Do NOT smoke after Midnight ? ? Take these medicines the morning of surgery with A SIP OF WATER: Carvedilol, Amlodipine, Levothyroxine ? ?How to Manage Your Diabetes ?Before and After Surgery ? ?Why is it important to control my blood sugar before and after surgery? ?Improving blood sugar levels before and after surgery helps healing and can limit problems. ?A way of improving blood sugar control is eating a healthy diet by: ? Eating less sugar and carbohydrates ? Increasing activity/exercise ? Talking with your doctor about reaching your blood sugar goals ?High blood sugars (greater than 180 mg/dL) can raise your risk of infections and slow your recovery, so you  will need to focus on controlling your diabetes during the weeks before surgery. ?Make sure that the doctor who takes care of your diabetes knows about your planned surgery including the date and location. ? ?How do I manage my blood sugar before surgery? ?Check your blood sugar at least 4 times a day, starting 2 days before surgery, to make sure that the level is not too high or low. ?Check your blood sugar the morning of your surgery when you wake up and every 2 hours until you get to the Short Stay unit. ?If your blood sugar is less than 70 mg/dL, you will need to treat for low blood sugar: ?Do not take insulin. ?Treat a low blood sugar (less than 70 mg/dL) with ? cup of clear juice (cranberry or apple), 4 glucose tablets, OR glucose gel. ?Recheck blood sugar in 15 minutes after treatment (to make sure it is greater than 70 mg/dL). If your blood sugar is not greater than 70 mg/dL on recheck, call [phone] for further instructions. ?Report your blood sugar to the short stay nurse when you get to Short Stay. ? ?If you are admitted to the hospital after surgery: ?Your blood sugar will  be checked by the staff and you will probably be given insulin after surgery (instead of oral diabetes medicines) to make sure you have good blood sugar levels. ?The goal for blood sugar control after surgery is 80-180 mg/dL. ? ? ?WHAT DO I DO ABOUT MY DIABETES MEDICATION? ? ?Do not take oral diabetes medicines (pills) the morning of surgery. ? ? ?   ?For patients with insulin pumps: ?Contact your diabetes doctor for specific instructions before surgery. ?Decrease basal rates by 20% at midnight the night before your surgery. ?Note that if your surgery is planned to be longer than 2 hours, your insulin pump will be removed and intravenous (IV) insulin will be started and managed by the nurses and the anesthesiologist. You will be able to restart your insulin pump once you are awake and able to manage it.  ?Make sure to bring insulin  pump supplies to the hospital with you in case the ? site needs to be changed. ? ? ? ?Bring CPAP mask and tubing day of surgery. ?                  ?           You may not have any metal on your body , jewelry, and body piercing ? ?           Do not wear  lotions, powders, /cologne, or deodorant ? ? ?            Men may shave face and neck. ? ? Do not bring valuables to the hospital. [facility] IS NOT ?            RESPONSIBLE   FOR VALUABLES. ? ? Contacts, dentures or bridgework may not be worn into surgery. ? ? Bring small overnight bag day of surgery. ?  ? ? Special Instructions: Bring a copy of your healthcare power of attorney and living will documents the day of surgery if you haven't scanned them before. ? ?            Please read over the following fact sheets you were given: IF YOU HAVE QUESTIONS ABOUT YOUR PRE-OP INSTRUCTIONS PLEASE CALL [phone] ? ?   [place] - Preparing for Surgery ?Before surgery, you can play an important role.  Because skin is not sterile, your skin needs to be as free of germs as possible.  You can reduce the number of germs on your skin by washing with CHG (chlorahexidine gluconate) soap before surgery.  CHG is an antiseptic cleaner which kills germs and bonds with the skin to continue killing germs even after washing. ?Please DO NOT use if you have an allergy to CHG or antibacterial soaps.  If your skin becomes reddened/irritated stop using the CHG and inform your nurse when you arrive at Short Stay. You may shave your face/neck. ?Please follow these instructions carefully: ? 1.  Shower with CHG Soap the night before surgery and the  morning of Surgery. ? 2.  If you choose to wash your hair, wash your hair first as usual with your  normal  shampoo. ? 3.  After you shampoo, rinse your hair and body thoroughly to remove the  shampoo.                        ?    4.  Use CHG as you would any other liquid soap.  You can apply chg directly  to the skin  and wash  ?                      Gently with a scrungie or clean washcloth. ? 5.  Apply the CHG Soap to your body ONLY FROM THE NECK DOWN.   Do not use on face/ open      ?                     Wound or open sores. Avoid contact with eyes, ears mouth and genitals (private parts).  ?                     Engineering geologistWash face,  Genitals (private parts) with your normal soap. ?            6.  Wash thoroughly, paying special attention to the area where your surgery  will be performed. ? 7.  Thoroughly rinse your body with warm water from the neck down. ? 8.  DO NOT shower/wash with your normal soap after using and rinsing off  the CHG Soap. ?               9.  Pat yourself dry with a clean towel. ?           10.  Wear clean pajamas. ?           11.  Place clean sheets on your bed the night of your first shower and do not  sleep with pets. ?Day of Surgery : ?Do not apply any lotions/deodorants the morning of surgery.  Please wear clean clothes to the hospital/surgery center. ? ?FAILURE TO FOLLOW THESE INSTRUCTIONS MAY RESULT IN THE CANCELLATION OF YOUR SURGERY ? ? ? ? ?________________________________________________________________________  ? ?Incentive Spirometer ? ?An incentive spirometer is a tool that can help keep your lungs clear and active. This tool measures how well you are filling your lungs with each breath. Taking long deep breaths may help reverse or decrease the chance of developing breathing (pulmonary) problems (especially infection) following: ?A long period of time when you are unable to move or be active. ?BEFORE THE PROCEDURE  ?If the spirometer includes an indicator to show your best effort, your nurse or respiratory therapist will set it to a desired goal. ?If possible, sit up straight or lean slightly forward. Try not to slouch. ?Hold the incentive spirometer in an upright position. ?INSTRUCTIONS FOR USE  ?Sit on the edge of your bed if possible, or sit up as far as you can in bed or on a chair. ?Hold the incentive spirometer in an  upright position. ?Breathe out normally. ?Place the mouthpiece in your mouth and seal your lips tightly around it. ?Breathe in slowly and as deeply as possible, raising the piston or the ball toward the top

## 2021-08-31 ENCOUNTER — Encounter (HOSPITAL_COMMUNITY)
Admission: RE | Admit: 2021-08-31 | Discharge: 2021-08-31 | Disposition: A | Discharge status: Home / Self Care | Payer: Medicare Other | Source: Ambulatory Visit | Attending: General Surgery | Admitting: General Surgery

## 2021-08-31 ENCOUNTER — Encounter (HOSPITAL_COMMUNITY): Payer: Self-pay

## 2021-08-31 ENCOUNTER — Other Ambulatory Visit: Payer: Self-pay

## 2021-08-31 VITALS — BP 179/82 | HR 73 | Temp 97.6°F | Resp 18 | Ht 73.0 in | Wt 222.0 lb

## 2021-08-31 DIAGNOSIS — E089 Diabetes mellitus due to underlying condition without complications: Secondary | ICD-10-CM | POA: Diagnosis not present

## 2021-08-31 DIAGNOSIS — Z01818 Encounter for other preprocedural examination: Secondary | ICD-10-CM | POA: Diagnosis present

## 2021-08-31 DIAGNOSIS — Z794 Long term (current) use of insulin: Secondary | ICD-10-CM | POA: Diagnosis not present

## 2021-08-31 HISTORY — DX: Family history of other specified conditions: Z84.89

## 2021-08-31 LAB — CBC
HCT: 39.9 % (ref 39.0–52.0)
Hemoglobin: 12.7 g/dL — ABNORMAL LOW (ref 13.0–17.0)
MCH: 27.1 pg (ref 26.0–34.0)
MCHC: 31.8 g/dL (ref 30.0–36.0)
MCV: 85.3 fL (ref 80.0–100.0)
Platelets: 253 10*3/uL (ref 150–400)
RBC: 4.68 MIL/uL (ref 4.22–5.81)
RDW: 14.9 % (ref 11.5–15.5)
WBC: 6.4 10*3/uL (ref 4.0–10.5)
nRBC: 0 % (ref 0.0–0.2)

## 2021-08-31 LAB — HEMOGLOBIN A1C
Hgb A1c MFr Bld: 7.6 % — ABNORMAL HIGH (ref 4.8–5.6)
Mean Plasma Glucose: 171.42 mg/dL

## 2021-08-31 LAB — BASIC METABOLIC PANEL
Anion gap: 6 (ref 5–15)
BUN: 25 mg/dL — ABNORMAL HIGH (ref 8–23)
CO2: 23 mmol/L (ref 22–32)
Calcium: 9.6 mg/dL (ref 8.9–10.3)
Chloride: 111 mmol/L (ref 98–111)
Creatinine, Ser: 1.34 mg/dL — ABNORMAL HIGH (ref 0.61–1.24)
GFR, Estimated: 59 mL/min — ABNORMAL LOW (ref >60)
Glucose, Bld: 124 mg/dL — ABNORMAL HIGH (ref 70–99)
Potassium: 4.4 mmol/L (ref 3.5–5.1)
Sodium: 140 mmol/L (ref 135–145)

## 2021-08-31 LAB — GLUCOSE, CAPILLARY: Glucose-Capillary: 158 mg/dL — ABNORMAL HIGH (ref 70–99)

## 2021-08-31 NOTE — Progress Notes (Signed)
Anesthesia note: ? ?Bowel prep reminder:NA ? ?PCP - Dr. Darlin Drop ?Cardiologist -none ?Other-  ? ?Chest x-ray - no ?EKG - 08/31/21-chart ?Stress Test - 2018 ?ECHO - no ?Cardiac Cath - NA ? ?Pacemaker/ICD device last checked:NA ? ?Sleep Study - no ?CPAP -  ? ?Pt uses an insulin pump and libre continuous will be on Rt arm on DOS ?Fasting Blood Sugar - 98-130 ?Checks Blood Sugar _____ ? ?Blood Thinner:NA ?Blood Thinner Instructions: ?Aspirin Instructions: ?Last Dose: ? ?Anesthesia review: yes ? ?Patient denies shortness of breath, fever, cough and chest pain at PAT appointment ?Pt has no SOB with activities ? ?Patient verbalized understanding of instructions that were given to them at the PAT appointment. Patient was also instructed that they will need to review over the PAT instructions again at home before surgery. yes ?

## 2021-09-06 DIAGNOSIS — Z8489 Family history of other specified conditions: Secondary | ICD-10-CM | POA: Insufficient documentation

## 2021-09-06 NOTE — Progress Notes (Signed)
?Cardiology Office Note:   ? ?Date:  09/07/2021  ? ?IDViaan Monroe, DOB 1956/04/25, MRN [id number] ? ?PCP:  Joycelyn Rua, MD  ?Cardiologist:  Norman Herrlich, MD   ? ?Referring MD: Joycelyn Rua, MD  ? ? ?ASSESSMENT:   ? ?1. Essential hypertension   ?2. Hyperlipidemia with target LDL less than 100   ?3. Syncope, unspecified syncope type   ? ?PLAN:   ? ?In order of problems listed above: ? ?BP remains above target despite a multidrug regimen and good lifestyle with sodium restriction and physical activity.  He will continue beta-blocker carvedilol telmisartan I will add centrally active clonidine at bedtime which I think will get him to target.  If not he will require a diuretic but have tried to avoid that with his mild CKD.  From my perspective he is optimized for his planned surgery.  He had evaluation December 2018 with a normal myocardial perfusion study no ischemia EF 55% and e event monitor at that time without significant abnormality.   ?Continue his statin LDL at target ?No recurrence of syncope felt to be due to hypoglycemia ? ? ?Next appointment: 9 months ? ? ? ?Medication Adjustments/Labs and Tests Ordered: ?Current medicines are reviewed at length with the patient today.  Concerns regarding medicines are outlined above.  ?No orders of the defined types were placed in this encounter. ? ?No orders of the defined types were placed in this encounter. ? ? ?Chief Complaint  ?Patient presents with  ? Follow-up  ? Hypertension  ? ? ?History of Present Illness:   ? ?Joshua Monroe is a 66 y.o. 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 ? ?Compliance with diet, lifestyle and medications: Yes ? ?Remains very active and athletic has no chest pain edema shortness of breath palpitation or syncope ?Scheduled for elective GI surgery ?Home blood pressures tend to run high 140s low 150s 70s to 80s. ?Scheduled for surgery laparoscopic  takedown of a banded gastroplasty 09/28/2021 ?Past Medical History:  ?Diagnosis Date  ? Achilles tendon injury 11/20/2014  ? Arthritis   ? Diabetes mellitus due to underlying condition (HCC) 04/24/2017  ? Essential hypertension 04/24/2017  ? Family history of adverse reaction to anesthesia   ? GERD (gastroesophageal reflux disease)   ? History of pancreatitis 1992  ? in hospital for 4 months developed DM from that  ? Hypertension   ? Pneumonia 04/2018  ? vertigo 04/24/2017  ? ? ?Past Surgical History:  ?Procedure Laterality Date  ? achilles tendon Right 2013  ? ACHILLES TENDON SURGERY Left 11/20/2014  ? Procedure: OPEN REPAIR OF A LEFT ACHILLES TENDON RUPTURE;  Surgeon: Ranee Gosselin, MD;  Location: [facility];  Service: Orthopedics;  Laterality: Left;  ? APPENDECTOMY    ? age 66  ? CHOLECYSTECTOMY    ? TONSILLECTOMY    ? ? ?Current Medications: ?Current Meds  ?Medication Sig  ? atorvastatin (LIPITOR) 80 MG tablet Take 80 mg by mouth at bedtime.  ? carvedilol (COREG) 6.25 MG tablet TAKE 1 TABLET(6.25 MG) BY MOUTH TWICE DAILY (Patient taking differently: Take 6.25 mg by mouth 2 (two) times daily.)  ? dapagliflozin propanediol (FARXIGA) 10 MG TABS tablet Take 10 mg by mouth every morning.  ? famotidine (PEPCID) 20 MG tablet TAKE 1 TABLET BY MOUTH AFTER SUPPER (Patient taking differently: Take 20 mg by mouth daily after supper.)  ? ibuprofen (ADVIL) 200 MG tablet Take 600 mg  by mouth 3 (three) times a week. Prior to playing tennis  ? Insulin Human (INSULIN PUMP) SOLN Inject into the skin continuous. Insulin: Lyumjev  ? levothyroxine (SYNTHROID) 25 MCG tablet Take 25 mcg by mouth daily before breakfast.  ? metFORMIN (GLUCOPHAGE) 1000 MG tablet Take 1,000 mg by mouth 2 (two) times daily.  ? Polyvinyl Alcohol-Povidone (REFRESH OP) Place 1 drop into both eyes daily as needed (dry eyes).  ? RABEprazole (ACIPHEX) 20 MG tablet Take 20 mg by mouth every morning.  ? telmisartan (MICARDIS) 40 MG tablet Take 1 tablet (40 mg total)  by mouth in the morning and at bedtime.  ? TURMERIC PO Take 1,000 mg by mouth every morning.  ?  ? ?Allergies:   Sandostatin [octreotide], Lisinopril, and Simvastatin  ? ?Social History  ? ?Socioeconomic History  ? Marital status: Married  ?  Spouse name: Not on file  ? Number of children: 3  ? Years of education: Not on file  ? Highest education level: Not on file  ?Occupational History  ? Not on file  ?Tobacco Use  ? Smoking status: Never  ? Smokeless tobacco: Never  ?Vaping Use  ? Vaping Use: Never used  ?Substance and Sexual Activity  ? Alcohol use: Yes  ?  Comment: rare  ? Drug use: No  ? Sexual activity: Not on file  ?Other Topics Concern  ? Not on file  ?Social History Narrative  ? Right Handed   ? Lives in a one story home   ? Drinks tea  ? ?Social Determinants of Health  ? ?Financial Resource Strain: Not on file  ?Food Insecurity: Not on file  ?Transportation Needs: Not on file  ?Physical Activity: Not on file  ?Stress: Not on file  ?Social Connections: Not on file  ?  ? ?Family History: ?The patient's family history includes Diabetes (age of onset: 21) in his mother; Healthy in his son. ?ROS:   ?Please see the history of present illness.    ?All other systems reviewed and are negative. ? ?EKGs/Labs/Other Studies Reviewed:   ? ?The following studies were reviewed today: ? ?EKG:  EKG form 08/31/2021 shows sinus rhythm LVH voltage no ischemic changes. ? ?Recent Labs: ?08/31/2021: BUN 25; Creatinine, Ser 1.34; Hemoglobin 12.7; Platelets 253; Potassium 4.4; Sodium 140  ?Recent Lipid Panel ?02/16/2021 cholesterol 131 LDL 69 ? ?Physical Exam:   ? ?VS:  BP (!) 156/72   Pulse 63   Ht 6\' 1"  (1.854 m)   Wt 225 lb (102.1 kg)   SpO2 99%   BMI 29.69 kg/m?    ? ?Wt Readings from Last 3 Encounters:  ?09/07/21 225 lb (102.1 kg)  ?08/31/21 222 lb (100.7 kg)  ?01/19/21 226 lb (102.5 kg)  ?  ? ?GEN:  Well nourished, well developed in no acute distress ?HEENT: Normal ?NECK: No JVD; No carotid bruits ?LYMPHATICS: No  lymphadenopathy ?CARDIAC: RRR, no murmurs, rubs, gallops ?RESPIRATORY:  Clear to auscultation without rales, wheezing or rhonchi  ?ABDOMEN: Soft, non-tender, non-distended ?MUSCULOSKELETAL:  No edema; No deformity  ?SKIN: Warm and dry ?NEUROLOGIC:  Alert and oriented x 3 ?PSYCHIATRIC:  Normal affect  ? ? ?Signed, ?01/21/21, MD  ?09/07/2021 11:03 AM    ?[facility]  ?

## 2021-09-07 ENCOUNTER — Ambulatory Visit (INDEPENDENT_AMBULATORY_CARE_PROVIDER_SITE_OTHER): Payer: Medicare Other | Admitting: Cardiology

## 2021-09-07 ENCOUNTER — Encounter: Payer: Self-pay | Admitting: Cardiology

## 2021-09-07 VITALS — BP 156/72 | HR 63 | Ht 73.0 in | Wt 225.0 lb

## 2021-09-07 DIAGNOSIS — I10 Essential (primary) hypertension: Secondary | ICD-10-CM | POA: Diagnosis not present

## 2021-09-07 DIAGNOSIS — E785 Hyperlipidemia, unspecified: Secondary | ICD-10-CM

## 2021-09-07 DIAGNOSIS — R55 Syncope and collapse: Secondary | ICD-10-CM | POA: Diagnosis not present

## 2021-09-07 MED ORDER — CLONIDINE HCL 0.1 MG PO TABS: 0.1000 mg | ORAL_TABLET | Freq: Every day | ORAL | 3 refills | Status: DC

## 2021-09-07 NOTE — Patient Instructions (Signed)
Medication Instructions:  ?Your physician has recommended you make the following change in your medication:  ? ?START: Clonidine 0.1 mg daily at bedtime ? ?*If you need a refill on your cardiac medications before your next appointment, please call your pharmacy* ? ? ?Lab Work: ?None ?If you have labs (blood work) drawn today and your tests are completely normal, you will receive your results only by: ?MyChart Message (if you have MyChart) OR ?A paper copy in the mail ?If you have any lab test that is abnormal or we need to change your treatment, we will call you to review the results. ? ? ?Testing/Procedures: ?None ? ? ?Follow-Up: ?At [facility], you and your health needs are our priority.  As part of our continuing mission to provide you with exceptional heart care, we have created designated Provider Care Teams.  These Care Teams include your primary Cardiologist (physician) and Advanced Practice Providers (APPs -  Physician Assistants and Nurse Practitioners) who all work together to provide you with the care you need, when you need it. ? ?We recommend signing up for the patient portal called "MyChart".  Sign up information is provided on this After Visit Summary.  MyChart is used to connect with patients for Virtual Visits (Telemedicine).  Patients are able to view lab/test results, encounter notes, upcoming appointments, etc.  Non-urgent messages can be sent to your provider as well.   ?To learn more about what you can do with MyChart, go to [email or web address].   ? ?Your next appointment:   ?6 month(s) ? ?The format for your next appointment:   ?In Person ? ?Provider:   ?Norman Herrlich, MD  ? ? ?Other Instructions ?Send via MyChart 2 weeks of blood pressures ? ?Important Information About Sugar ? ? ? ? ? ? ?

## 2021-09-15 ENCOUNTER — Ambulatory Visit: Payer: Self-pay | Admitting: General Surgery

## 2021-09-15 DIAGNOSIS — E089 Diabetes mellitus due to underlying condition without complications: Secondary | ICD-10-CM

## 2021-09-28 ENCOUNTER — Ambulatory Visit (HOSPITAL_BASED_OUTPATIENT_CLINIC_OR_DEPARTMENT_OTHER): Payer: Medicare Other | Admitting: Anesthesiology

## 2021-09-28 ENCOUNTER — Encounter (HOSPITAL_COMMUNITY): Payer: Self-pay | Admitting: General Surgery

## 2021-09-28 ENCOUNTER — Encounter (HOSPITAL_COMMUNITY)
Admission: RE | Disposition: A | Discharge status: Home / Self Care | Payer: Self-pay | Source: Ambulatory Visit | Attending: General Surgery

## 2021-09-28 ENCOUNTER — Other Ambulatory Visit: Payer: Self-pay

## 2021-09-28 ENCOUNTER — Ambulatory Visit (HOSPITAL_COMMUNITY): Payer: Medicare Other | Admitting: Emergency Medicine

## 2021-09-28 ENCOUNTER — Observation Stay (HOSPITAL_COMMUNITY)
Admission: RE | Admit: 2021-09-28 | Discharge: 2021-09-29 | Disposition: A | Discharge status: Home / Self Care | Payer: Medicare Other | Source: Ambulatory Visit | Attending: General Surgery | Admitting: General Surgery

## 2021-09-28 DIAGNOSIS — M1990 Unspecified osteoarthritis, unspecified site: Secondary | ICD-10-CM

## 2021-09-28 DIAGNOSIS — Z9889 Other specified postprocedural states: Secondary | ICD-10-CM

## 2021-09-28 DIAGNOSIS — K2100 Gastro-esophageal reflux disease with esophagitis, without bleeding: Secondary | ICD-10-CM | POA: Insufficient documentation

## 2021-09-28 DIAGNOSIS — K259 Gastric ulcer, unspecified as acute or chronic, without hemorrhage or perforation: Secondary | ICD-10-CM | POA: Diagnosis not present

## 2021-09-28 DIAGNOSIS — I10 Essential (primary) hypertension: Secondary | ICD-10-CM | POA: Insufficient documentation

## 2021-09-28 DIAGNOSIS — K858 Other acute pancreatitis without necrosis or infection: Secondary | ICD-10-CM | POA: Insufficient documentation

## 2021-09-28 DIAGNOSIS — E109 Type 1 diabetes mellitus without complications: Secondary | ICD-10-CM | POA: Insufficient documentation

## 2021-09-28 DIAGNOSIS — R1110 Vomiting, unspecified: Secondary | ICD-10-CM | POA: Diagnosis not present

## 2021-09-28 DIAGNOSIS — Z8719 Personal history of other diseases of the digestive system: Secondary | ICD-10-CM | POA: Diagnosis not present

## 2021-09-28 DIAGNOSIS — E139 Other specified diabetes mellitus without complications: Secondary | ICD-10-CM | POA: Insufficient documentation

## 2021-09-28 DIAGNOSIS — K449 Diaphragmatic hernia without obstruction or gangrene: Secondary | ICD-10-CM | POA: Diagnosis not present

## 2021-09-28 DIAGNOSIS — Z9049 Acquired absence of other specified parts of digestive tract: Secondary | ICD-10-CM | POA: Insufficient documentation

## 2021-09-28 DIAGNOSIS — Z4689 Encounter for fitting and adjustment of other specified devices: Principal | ICD-10-CM | POA: Insufficient documentation

## 2021-09-28 DIAGNOSIS — Z794 Long term (current) use of insulin: Secondary | ICD-10-CM | POA: Insufficient documentation

## 2021-09-28 DIAGNOSIS — E089 Diabetes mellitus due to underlying condition without complications: Secondary | ICD-10-CM

## 2021-09-28 DIAGNOSIS — Z833 Family history of diabetes mellitus: Secondary | ICD-10-CM | POA: Diagnosis not present

## 2021-09-28 DIAGNOSIS — Z8379 Family history of other diseases of the digestive system: Secondary | ICD-10-CM | POA: Insufficient documentation

## 2021-09-28 DIAGNOSIS — Z7984 Long term (current) use of oral hypoglycemic drugs: Secondary | ICD-10-CM | POA: Diagnosis not present

## 2021-09-28 DIAGNOSIS — Z79899 Other long term (current) drug therapy: Secondary | ICD-10-CM | POA: Insufficient documentation

## 2021-09-28 HISTORY — PX: LAPAROSCOPIC LYSIS OF ADHESIONS: SHX5905

## 2021-09-28 HISTORY — PX: UPPER GI ENDOSCOPY: SHX6162

## 2021-09-28 HISTORY — DX: Other specified postprocedural states: Z98.890

## 2021-09-28 LAB — CBC WITH DIFFERENTIAL/PLATELET
Abs Immature Granulocytes: 0.02 10*3/uL (ref 0.00–0.07)
Basophils Absolute: 0 10*3/uL (ref 0.0–0.1)
Basophils Relative: 1 %
Eosinophils Absolute: 0.2 10*3/uL (ref 0.0–0.5)
Eosinophils Relative: 3 %
HCT: 38.8 % — ABNORMAL LOW (ref 39.0–52.0)
Hemoglobin: 12.2 g/dL — ABNORMAL LOW (ref 13.0–17.0)
Immature Granulocytes: 0 %
Lymphocytes Relative: 23 %
Lymphs Abs: 1.3 10*3/uL (ref 0.7–4.0)
MCH: 27.5 pg (ref 26.0–34.0)
MCHC: 31.4 g/dL (ref 30.0–36.0)
MCV: 87.6 fL (ref 80.0–100.0)
Monocytes Absolute: 0.6 10*3/uL (ref 0.1–1.0)
Monocytes Relative: 10 %
Neutro Abs: 3.7 10*3/uL (ref 1.7–7.7)
Neutrophils Relative %: 63 %
Platelets: 221 10*3/uL (ref 150–400)
RBC: 4.43 MIL/uL (ref 4.22–5.81)
RDW: 15.3 % (ref 11.5–15.5)
WBC: 5.9 10*3/uL (ref 4.0–10.5)
nRBC: 0 % (ref 0.0–0.2)

## 2021-09-28 LAB — COMPREHENSIVE METABOLIC PANEL
ALT: 18 U/L (ref 0–44)
AST: 26 U/L (ref 15–41)
Albumin: 4.1 g/dL (ref 3.5–5.0)
Alkaline Phosphatase: 91 U/L (ref 38–126)
Anion gap: 8 (ref 5–15)
BUN: 25 mg/dL — ABNORMAL HIGH (ref 8–23)
CO2: 21 mmol/L — ABNORMAL LOW (ref 22–32)
Calcium: 9.2 mg/dL (ref 8.9–10.3)
Chloride: 110 mmol/L (ref 98–111)
Creatinine, Ser: 1.48 mg/dL — ABNORMAL HIGH (ref 0.61–1.24)
GFR, Estimated: 52 mL/min — ABNORMAL LOW (ref >60)
Glucose, Bld: 123 mg/dL — ABNORMAL HIGH (ref 70–99)
Potassium: 4.4 mmol/L (ref 3.5–5.1)
Sodium: 139 mmol/L (ref 135–145)
Total Bilirubin: 0.9 mg/dL (ref 0.3–1.2)
Total Protein: 7.1 g/dL (ref 6.5–8.1)

## 2021-09-28 LAB — GLUCOSE, CAPILLARY
Glucose-Capillary: 128 mg/dL — ABNORMAL HIGH (ref 70–99)
Glucose-Capillary: 183 mg/dL — ABNORMAL HIGH (ref 70–99)
Glucose-Capillary: 180 mg/dL — ABNORMAL HIGH (ref 70–99)

## 2021-09-28 SURGERY — REMOVAL, GASTRIC BAND, LAPAROSCOPIC
Anesthesia: General

## 2021-09-28 MED ORDER — AMISULPRIDE (ANTIEMETIC) 5 MG/2ML IV SOLN: 10.0000 mg | Freq: Once | INTRAVENOUS | Status: DC | PRN

## 2021-09-28 MED ORDER — ONDANSETRON HCL 4 MG/2ML IJ SOLN: 4.0000 mg | INTRAMUSCULAR | Status: DC | PRN

## 2021-09-28 MED ORDER — SCOPOLAMINE 1 MG/3DAYS TD PT72
1.0000 | MEDICATED_PATCH | TRANSDERMAL | Status: DC
Start: 2021-09-28 — End: 2021-09-28
  Administered 2021-09-28: 1.5 mg via TRANSDERMAL
  Filled 2021-09-28: qty 1

## 2021-09-28 MED ORDER — DEXTROSE-NACL 5-0.45 % IV SOLN: INTRAVENOUS | Status: DC

## 2021-09-28 MED ORDER — ENSURE MAX PROTEIN PO LIQD
2.0000 [oz_av] | ORAL | Status: DC
  Administered 2021-09-29 (×3): 2 [oz_av] via ORAL

## 2021-09-28 MED ORDER — HEPARIN SODIUM (PORCINE) 5000 UNIT/ML IJ SOLN
5000.0000 [IU] | INTRAMUSCULAR | Status: AC
Start: 2021-09-28 — End: 2021-09-28
  Administered 2021-09-28: 5000 [IU] via SUBCUTANEOUS
  Filled 2021-09-28: qty 1

## 2021-09-28 MED ORDER — OXYCODONE HCL 5 MG/5ML PO SOLN: 5.0000 mg | Freq: Once | ORAL | Status: DC | PRN

## 2021-09-28 MED ORDER — ACETAMINOPHEN 500 MG PO TABS
1000.0000 mg | ORAL_TABLET | ORAL | Status: AC
  Administered 2021-09-28: 1000 mg via ORAL
  Filled 2021-09-28: qty 2

## 2021-09-28 MED ORDER — OXYCODONE HCL 5 MG/5ML PO SOLN: 5.0000 mg | Freq: Four times a day (QID) | ORAL | Status: DC | PRN

## 2021-09-28 MED ORDER — PROPOFOL 10 MG/ML IV BOLUS
INTRAVENOUS | Status: AC
  Filled 2021-09-28: qty 20

## 2021-09-28 MED ORDER — BUPIVACAINE LIPOSOME 1.3 % IJ SUSP: 20.0000 mL | Freq: Once | INTRAMUSCULAR | Status: DC

## 2021-09-28 MED ORDER — DEXAMETHASONE SODIUM PHOSPHATE 4 MG/ML IJ SOLN: 4.0000 mg | INTRAMUSCULAR | Status: DC

## 2021-09-28 MED ORDER — LACTATED RINGERS IV SOLN
INTRAVENOUS | Status: DC
Start: 2021-09-28 — End: 2021-09-28

## 2021-09-28 MED ORDER — MORPHINE SULFATE (PF) 2 MG/ML IV SOLN: 1.0000 mg | INTRAVENOUS | Status: DC | PRN

## 2021-09-28 MED ORDER — BUPIVACAINE LIPOSOME 1.3 % IJ SUSP
INTRAMUSCULAR | Status: DC | PRN
  Administered 2021-09-28: 20 mL

## 2021-09-28 MED ORDER — PANTOPRAZOLE SODIUM 40 MG PO TBEC
40.0000 mg | DELAYED_RELEASE_TABLET | Freq: Every day | ORAL | Status: DC
Start: 2021-09-28 — End: 2021-09-29
  Administered 2021-09-28 – 2021-09-29 (×2): 40 mg via ORAL
  Filled 2021-09-28 (×2): qty 1

## 2021-09-28 MED ORDER — HYDRALAZINE HCL 20 MG/ML IJ SOLN: 10.0000 mg | INTRAMUSCULAR | Status: DC | PRN

## 2021-09-28 MED ORDER — CHLORHEXIDINE GLUCONATE CLOTH 2 % EX PADS: 6.0000 | MEDICATED_PAD | Freq: Once | CUTANEOUS | Status: DC

## 2021-09-28 MED ORDER — HYDROMORPHONE HCL 1 MG/ML IJ SOLN
INTRAMUSCULAR | Status: DC | PRN
  Administered 2021-09-28: .5 mg via INTRAVENOUS

## 2021-09-28 MED ORDER — MIDAZOLAM HCL 2 MG/2ML IJ SOLN
INTRAMUSCULAR | Status: AC
  Filled 2021-09-28: qty 2

## 2021-09-28 MED ORDER — ROCURONIUM BROMIDE 10 MG/ML (PF) SYRINGE
PREFILLED_SYRINGE | INTRAVENOUS | Status: AC
  Filled 2021-09-28: qty 10

## 2021-09-28 MED ORDER — ONDANSETRON HCL 4 MG/2ML IJ SOLN
INTRAMUSCULAR | Status: AC
  Filled 2021-09-28: qty 2

## 2021-09-28 MED ORDER — DEXMEDETOMIDINE (PRECEDEX) IN NS 20 MCG/5ML (4 MCG/ML) IV SYRINGE
PREFILLED_SYRINGE | INTRAVENOUS | Status: DC | PRN
  Administered 2021-09-28: 4 ug via INTRAVENOUS
  Administered 2021-09-28: 8 ug via INTRAVENOUS
  Administered 2021-09-28: 4 ug via INTRAVENOUS

## 2021-09-28 MED ORDER — FAMOTIDINE IN NACL 20-0.9 MG/50ML-% IV SOLN
20.0000 mg | Freq: Two times a day (BID) | INTRAVENOUS | Status: DC
  Administered 2021-09-28: 20 mg via INTRAVENOUS
  Filled 2021-09-28 (×2): qty 50

## 2021-09-28 MED ORDER — ONDANSETRON HCL 4 MG/2ML IJ SOLN
INTRAMUSCULAR | Status: DC | PRN
  Administered 2021-09-28: 4 mg via INTRAVENOUS

## 2021-09-28 MED ORDER — CLONIDINE HCL 0.1 MG PO TABS
0.1000 mg | ORAL_TABLET | Freq: Every day | ORAL | Status: DC
  Administered 2021-09-28: 0.1 mg via ORAL
  Filled 2021-09-28: qty 1

## 2021-09-28 MED ORDER — EPHEDRINE SULFATE (PRESSORS) 50 MG/ML IJ SOLN
INTRAMUSCULAR | Status: DC | PRN
  Administered 2021-09-28 (×2): 5 mg via INTRAVENOUS

## 2021-09-28 MED ORDER — EPHEDRINE 5 MG/ML INJ
INTRAVENOUS | Status: AC
  Filled 2021-09-28: qty 5

## 2021-09-28 MED ORDER — FENTANYL CITRATE PF 50 MCG/ML IJ SOSY: 25.0000 ug | PREFILLED_SYRINGE | INTRAMUSCULAR | Status: DC | PRN

## 2021-09-28 MED ORDER — LEVOTHYROXINE SODIUM 25 MCG PO TABS
25.0000 ug | ORAL_TABLET | Freq: Every day | ORAL | Status: DC
  Administered 2021-09-29: 25 ug via ORAL
  Filled 2021-09-28: qty 1

## 2021-09-28 MED ORDER — BUPIVACAINE-EPINEPHRINE (PF) 0.25% -1:200000 IJ SOLN
INTRAMUSCULAR | Status: AC
  Filled 2021-09-28: qty 30

## 2021-09-28 MED ORDER — DAPAGLIFLOZIN PROPANEDIOL 10 MG PO TABS
10.0000 mg | ORAL_TABLET | Freq: Every morning | ORAL | Status: DC
  Administered 2021-09-28 – 2021-09-29 (×2): 10 mg via ORAL
  Filled 2021-09-28 (×2): qty 1

## 2021-09-28 MED ORDER — HYDROMORPHONE HCL 2 MG/ML IJ SOLN
INTRAMUSCULAR | Status: AC
  Filled 2021-09-28: qty 1

## 2021-09-28 MED ORDER — DEXAMETHASONE SODIUM PHOSPHATE 10 MG/ML IJ SOLN
INTRAMUSCULAR | Status: DC | PRN
  Administered 2021-09-28: 4 mg via INTRAVENOUS

## 2021-09-28 MED ORDER — CARVEDILOL 6.25 MG PO TABS
6.2500 mg | ORAL_TABLET | Freq: Two times a day (BID) | ORAL | Status: DC
  Administered 2021-09-28 – 2021-09-29 (×2): 6.25 mg via ORAL
  Filled 2021-09-28 (×2): qty 1

## 2021-09-28 MED ORDER — FENTANYL CITRATE (PF) 250 MCG/5ML IJ SOLN
INTRAMUSCULAR | Status: AC
  Filled 2021-09-28: qty 5

## 2021-09-28 MED ORDER — DEXAMETHASONE SODIUM PHOSPHATE 10 MG/ML IJ SOLN
INTRAMUSCULAR | Status: AC
  Filled 2021-09-28: qty 1

## 2021-09-28 MED ORDER — LIDOCAINE HCL (CARDIAC) PF 100 MG/5ML IV SOSY
PREFILLED_SYRINGE | INTRAVENOUS | Status: DC | PRN
  Administered 2021-09-28: 50 mg via INTRAVENOUS

## 2021-09-28 MED ORDER — ENOXAPARIN SODIUM 30 MG/0.3ML IJ SOSY
30.0000 mg | PREFILLED_SYRINGE | Freq: Two times a day (BID) | INTRAMUSCULAR | Status: DC
  Administered 2021-09-28 – 2021-09-29 (×2): 30 mg via SUBCUTANEOUS
  Filled 2021-09-28 (×2): qty 0.3

## 2021-09-28 MED ORDER — ACETAMINOPHEN 160 MG/5ML PO SOLN: 325.0000 mg | ORAL | Status: DC | PRN

## 2021-09-28 MED ORDER — LIDOCAINE HCL (PF) 2 % IJ SOLN
INTRAMUSCULAR | Status: AC
  Filled 2021-09-28: qty 5

## 2021-09-28 MED ORDER — 0.9 % SODIUM CHLORIDE (POUR BTL) OPTIME
TOPICAL | Status: DC | PRN
  Administered 2021-09-28: 1000 mL

## 2021-09-28 MED ORDER — ORAL CARE MOUTH RINSE: 15.0000 mL | Freq: Once | OROMUCOSAL | Status: AC

## 2021-09-28 MED ORDER — LACTATED RINGERS IR SOLN
Status: DC | PRN
  Administered 2021-09-28: 1000 mL

## 2021-09-28 MED ORDER — METFORMIN HCL 500 MG PO TABS
1000.0000 mg | ORAL_TABLET | Freq: Two times a day (BID) | ORAL | Status: DC
  Administered 2021-09-29: 1000 mg via ORAL
  Filled 2021-09-28: qty 2

## 2021-09-28 MED ORDER — ACETAMINOPHEN 160 MG/5ML PO SOLN
1000.0000 mg | Freq: Three times a day (TID) | ORAL | Status: DC
  Administered 2021-09-28: 1000 mg via ORAL
  Filled 2021-09-28 (×2): qty 40.6

## 2021-09-28 MED ORDER — FENTANYL CITRATE (PF) 100 MCG/2ML IJ SOLN
INTRAMUSCULAR | Status: AC
  Filled 2021-09-28: qty 2

## 2021-09-28 MED ORDER — ACETAMINOPHEN 325 MG PO TABS: 325.0000 mg | ORAL_TABLET | ORAL | Status: DC | PRN

## 2021-09-28 MED ORDER — APREPITANT 40 MG PO CAPS
40.0000 mg | ORAL_CAPSULE | ORAL | Status: AC
  Administered 2021-09-28: 40 mg via ORAL
  Filled 2021-09-28: qty 1

## 2021-09-28 MED ORDER — BUPIVACAINE LIPOSOME 1.3 % IJ SUSP
INTRAMUSCULAR | Status: AC
  Filled 2021-09-28: qty 20

## 2021-09-28 MED ORDER — BUPIVACAINE-EPINEPHRINE 0.25% -1:200000 IJ SOLN
INTRAMUSCULAR | Status: DC | PRN
  Administered 2021-09-28: 30 mL

## 2021-09-28 MED ORDER — SUGAMMADEX SODIUM 200 MG/2ML IV SOLN
INTRAVENOUS | Status: DC | PRN
  Administered 2021-09-28: 50 mg via INTRAVENOUS
  Administered 2021-09-28: 200 mg via INTRAVENOUS

## 2021-09-28 MED ORDER — FENTANYL CITRATE (PF) 100 MCG/2ML IJ SOLN
INTRAMUSCULAR | Status: DC | PRN
  Administered 2021-09-28 (×5): 50 ug via INTRAVENOUS

## 2021-09-28 MED ORDER — HYDRALAZINE HCL 20 MG/ML IJ SOLN
INTRAMUSCULAR | Status: DC | PRN
  Administered 2021-09-28: 2.5 mg via INTRAVENOUS

## 2021-09-28 MED ORDER — CHLORHEXIDINE GLUCONATE 0.12 % MT SOLN
15.0000 mL | Freq: Once | OROMUCOSAL | Status: AC
  Administered 2021-09-28: 15 mL via OROMUCOSAL

## 2021-09-28 MED ORDER — MIDAZOLAM HCL 5 MG/5ML IJ SOLN
INTRAMUSCULAR | Status: DC | PRN
Start: 2021-09-28 — End: 2021-09-28
  Administered 2021-09-28 (×2): 1 mg via INTRAVENOUS

## 2021-09-28 MED ORDER — ROCURONIUM BROMIDE 100 MG/10ML IV SOLN
INTRAVENOUS | Status: DC | PRN
  Administered 2021-09-28: 60 mg via INTRAVENOUS
  Administered 2021-09-28 (×2): 30 mg via INTRAVENOUS

## 2021-09-28 MED ORDER — PROPOFOL 10 MG/ML IV BOLUS
INTRAVENOUS | Status: DC | PRN
Start: 2021-09-28 — End: 2021-09-28
  Administered 2021-09-28: 150 mg via INTRAVENOUS

## 2021-09-28 MED ORDER — GLYCOPYRROLATE 0.2 MG/ML IJ SOLN
INTRAMUSCULAR | Status: DC | PRN
  Administered 2021-09-28: .1 mg via INTRAVENOUS

## 2021-09-28 MED ORDER — INSULIN PUMP
SUBCUTANEOUS | Status: DC
  Administered 2021-09-28: 8 via SUBCUTANEOUS
  Filled 2021-09-28: qty 1

## 2021-09-28 MED ORDER — OXYCODONE HCL 5 MG PO TABS: 5.0000 mg | ORAL_TABLET | Freq: Once | ORAL | Status: DC | PRN

## 2021-09-28 MED ORDER — IRBESARTAN 75 MG PO TABS
75.0000 mg | ORAL_TABLET | Freq: Every day | ORAL | Status: DC
  Administered 2021-09-28 – 2021-09-29 (×2): 75 mg via ORAL
  Filled 2021-09-28 (×2): qty 1

## 2021-09-28 MED ORDER — ACETAMINOPHEN 10 MG/ML IV SOLN: 1000.0000 mg | Freq: Once | INTRAVENOUS | Status: DC | PRN

## 2021-09-28 MED ORDER — SODIUM CHLORIDE 0.9 % IV SOLN
2.0000 g | INTRAVENOUS | Status: AC
  Administered 2021-09-28: 2 g via INTRAVENOUS
  Filled 2021-09-28: qty 2

## 2021-09-28 MED ORDER — INSULIN PUMP
SUBCUTANEOUS | Status: DC
Start: 2021-09-28 — End: 2021-09-28

## 2021-09-28 MED ORDER — ACETAMINOPHEN 500 MG PO TABS
1000.0000 mg | ORAL_TABLET | Freq: Three times a day (TID) | ORAL | Status: DC
  Administered 2021-09-28 – 2021-09-29 (×2): 1000 mg via ORAL
  Filled 2021-09-28 (×3): qty 2

## 2021-09-28 MED ORDER — SIMETHICONE 80 MG PO CHEW: 80.0000 mg | CHEWABLE_TABLET | Freq: Four times a day (QID) | ORAL | Status: DC | PRN

## 2021-09-28 SURGICAL SUPPLY — 36 items
APPLICATOR COTTON TIP 6 STRL (MISCELLANEOUS) IMPLANT
APPLICATOR COTTON TIP 6IN STRL (MISCELLANEOUS) ×2
APPLIER CLIP 5 13 M/L LIGAMAX5 (MISCELLANEOUS)
CHLORAPREP W/TINT 26 (MISCELLANEOUS) ×2 IMPLANT
CLIP APPLIE 5 13 M/L LIGAMAX5 (MISCELLANEOUS) IMPLANT
COVER SURGICAL LIGHT HANDLE (MISCELLANEOUS) ×2 IMPLANT
ELECT L-HOOK LAP 45CM DISP (ELECTROSURGICAL) ×2
ELECT REM PT RETURN 15FT ADLT (MISCELLANEOUS) ×2 IMPLANT
ELECTRODE L-HOOK LAP 45CM DISP (ELECTROSURGICAL) ×1 IMPLANT
GLOVE BIOGEL PI IND STRL 7.0 (GLOVE) ×1 IMPLANT
GLOVE BIOGEL PI INDICATOR 7.0 (GLOVE) ×1
GLOVE SURG POLYISO LF SZ7 (GLOVE) ×2 IMPLANT
GOWN STRL REUS W/ TWL XL LVL3 (GOWN DISPOSABLE) IMPLANT
GOWN STRL REUS W/TWL XL LVL3 (GOWN DISPOSABLE)
IRRIG SUCT STRYKERFLOW 2 WTIP (MISCELLANEOUS) ×2
IRRIGATION SUCT STRKRFLW 2 WTP (MISCELLANEOUS) ×1 IMPLANT
KIT BASIN OR (CUSTOM PROCEDURE TRAY) ×2 IMPLANT
KIT TURNOVER KIT A (KITS) ×1 IMPLANT
MARKER SKIN DUAL TIP RULER LAB (MISCELLANEOUS) ×2 IMPLANT
NS IRRIG 1000ML POUR BTL (IV SOLUTION) ×2 IMPLANT
PACK CARDIOVASCULAR III (CUSTOM PROCEDURE TRAY) ×2 IMPLANT
PAD POSITIONING PINK XL (MISCELLANEOUS) ×2 IMPLANT
SCISSORS LAP 5X45 EPIX DISP (ENDOMECHANICALS) ×2 IMPLANT
SET TUBE SMOKE EVAC HIGH FLOW (TUBING) ×2 IMPLANT
SHEARS HARMONIC ACE PLUS 45CM (MISCELLANEOUS) ×2 IMPLANT
SLEEVE ENDOPATH XCEL 5M (ENDOMECHANICALS) ×4 IMPLANT
SOL ANTI FOG 6CC (MISCELLANEOUS) ×1 IMPLANT
SOLUTION ANTI FOG 6CC (MISCELLANEOUS) ×1
SUT ETHIBOND 0 36 GRN (SUTURE) ×2 IMPLANT
SUT MNCRL AB 4-0 PS2 18 (SUTURE) ×2 IMPLANT
SUT SILK 2 0 SH (SUTURE) ×3 IMPLANT
TOWEL OR 17X26 10 PK STRL BLUE (TOWEL DISPOSABLE) ×3 IMPLANT
TOWEL OR NON WOVEN STRL DISP B (DISPOSABLE) ×2 IMPLANT
TROCAR UNIVERSAL OPT 12M 100M (ENDOMECHANICALS) ×1 IMPLANT
TROCAR XCEL 12X100 BLDLESS (ENDOMECHANICALS) ×1 IMPLANT
TROCAR XCEL NON-BLD 5MMX100MML (ENDOMECHANICALS) ×2 IMPLANT

## 2021-09-28 NOTE — Anesthesia Procedure Notes (Signed)
Procedure Name: Intubation ?Date/Time: 09/28/2021 7:42 AM ?Performed by: Garrel Ridgel, CRNA ?Pre-anesthesia Checklist: Patient identified, Emergency Drugs available, Suction available and Patient being monitored ?Patient Re-evaluated:Patient Re-evaluated prior to induction ?Oxygen Delivery Method: Circle system utilized ?Preoxygenation: Pre-oxygenation with 100% oxygen ?Induction Type: IV induction ?Ventilation: Mask ventilation without difficulty ?Laryngoscope Size: Mac and 4 ?Grade View: Grade I ?Tube type: Oral ?Number of attempts: 1 ?Airway Equipment and Method: Stylet and Oral airway ?Placement Confirmation: ETT inserted through vocal cords under direct vision, positive ETCO2 and breath sounds checked- equal and bilateral ?Secured at: 22 (ett by EMT student) cm ?Tube secured with: Tape ?Dental Injury: Teeth and Oropharynx as per pre-operative assessment  ? ? ? ? ?

## 2021-09-28 NOTE — H&P (Signed)
Chief Complaint: New Patient (New patient paraesophageal hernia ) ? ? ?History of Present Illness: ?Joshua Monroe is a 66 y.o. male who is seen today as an office consultation for evaluation of New Patient (New patient paraesophageal hernia ) ?.  ? 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 He had a banded gastroplasty in 1992 for concern of insulin induced hyperphagia. He also had a transverse colectomy from pancreatic inflammation leading to obstruction. In 1995 he had a surgery for removal of cartilage related to the initial surgical scar. ? ?He has had reflux for the last 20 years. At times it was awful leading to regurgitation and severe burning worse at night. Recently, he has been working with Dr. Doretha Imus and has significant improvement with aciphex. He does continues to have reflux at night which is worsened with later dinners. ? ?He is interested in surgical improvement but is hesitant about the idea of increased ability to eat and nervous about possible weight gain. ? ?Review of Systems: ?A complete review of systems was obtained from the patient. I have reviewed this information and discussed as appropriate with the patient. See HPI as well for other ROS. ? ?Review of Systems  ?Constitutional: Negative.  ?HENT: Negative.  ?Eyes: Negative.  ?Respiratory: Negative.  ?Cardiovascular: Negative.  ?Gastrointestinal: Negative.  ?Genitourinary: Negative.  ?Musculoskeletal: Negative.  ?Skin: Negative.  ?Neurological: Negative.  ?Endo/Heme/Allergies: Negative.  ?Psychiatric/Behavioral: Negative.  ? ? ?Medical History: ?Past Medical History:  ?Diagnosis Date  ? Anemia  ? Arthritis  ? Diabetes (CMS-HCC)  ? GERD (gastroesophageal reflux disease)  ? HTN (hypertension)  ? Joint disease  ? Pancreatitis  ? ?Patient Active Problem List  ?Diagnosis  ? Acute recurrent pancreatitis  ? Pancreatitis Panel negative  ? ?Past Surgical History:  ?Procedure Laterality  Date  ? TONSILLECTOMY 05/23/1966  ? APPENDECTOMY 05/23/1978  ? ADENOIDECTOMY 05/23/1984  ? ACHILLES TENDON REPAIR Right 05/23/1988  ? COLECTOMY PARTIAL W/ANASTAMOSIS 05/23/1990  ? intestinal blockage 1992  ? CHOLECYSTECTOMY 05/23/1992  ? gastric band procedure 05/23/1996  ? ACHILLES TENDON REPAIR Left 05/23/2014  ? ? ?Allergies  ?Allergen Reactions  ? Sandostatin [Octreotide Acetate] Muscle Pain  ? ?Current Outpatient Medications on File Prior to Visit  ?Medication Sig Dispense Refill  ? atorvastatin (LIPITOR) 40 MG tablet Take 40 mg by mouth nightly.  ? carvediloL (COREG) 6.25 MG tablet  ? dapagliflozin (FARXIGA) 10 mg tablet 1 tablet  ? ibuprofen (ADVIL,MOTRIN) 200 MG tablet Take 200 mg by mouth 2 (two) times daily.  ? INSULIN LISPRO (HUMALOG SUBQ) Inject subcutaneously. Per pump  ? metFORMIN (GLUMETZA) 1000 MG (MOD) ER tablet Take 1,000 mg by mouth every morning. Takes 2 pills in the Am  ? pancrelipase (CREON) 12,000-38,000 -60,000 unit DR capsule Take 36,000 capsules by mouth 3 (three) times daily as needed.  ? RABEprazole (ACIPHEX) 20 mg EC tablet Take 20 mg by mouth once daily.  ? TURMERIC ROOT EXTRACT ORAL Take 1,050 mg by mouth once daily.  ? ?No current facility-administered medications on file prior to visit.  ? ?Family History  ?Problem Relation Age of Onset  ? Diabetes Mother  ? Skin cancer Father  ? Skin cancer, non-melanoma Father  ? Breast cancer Sister 86  ? Pancreatitis Son 5  ? ? ?Social History  ? ?Tobacco Use  ?Smoking Status Never  ?Smokeless Tobacco Never  ? ? ?Social History  ? ?Socioeconomic History  ? Marital status: Married  ? Number of children: 3  ?  Tobacco Use  ? Smoking status: Never  ? Smokeless tobacco: Never  ?Substance and Sexual Activity  ? Alcohol use: Never  ? Drug use: No  ? ?Objective:  ? ?Vitals:  ?07/27/21 1526  ?BP: 138/86  ?Pulse: 74  ?Temp: 36.4 ?C (97.6 ?F)  ?SpO2: 98%  ?Weight: (!) 102.1 kg (225 lb)  ?Height: 185.4 cm (6\' 1" )  ? ?Body mass index is 29.69  kg/m?. ? ?Physical Exam ?Constitutional:  ?Appearance: Normal appearance.  ?HENT:  ?Head: Normocephalic and atraumatic.  ?Pulmonary:  ?Effort: Pulmonary effort is normal.  ?Musculoskeletal:  ?General: Normal range of motion.  ?Cervical back: Normal range of motion.  ?Neurological:  ?General: No focal deficit present.  ?Mental Status: He is alert and oriented to person, place, and time. Mental status is at baseline.  ?Psychiatric:  ?Mood and Affect: Mood normal.  ?Behavior: Behavior normal.  ?Thought Content: Thought content normal.  ? ? ? ?Labs, Imaging and Diagnostic Testing: ?I reviewed EGD images by Dr. . I reviewed CT scan from 2022 and 2020 showing a stapled VBG without visualized silastic or silicone ring. I reviewed notes by 2021. ? ?Assessment and Plan:  ? ?Diagnoses and all orders for this visit: ? ?S/P gastroplasty ? ?Gastroesophageal reflux disease with esophagitis without hemorrhage ? ?Chronic gastric erosion ? ?He appears to have anatomy consistent with VBG on CT and EGD. He has severe reflux and regurgitation which is commonly seen in this augmented anatomy. He notes a revision of surgery related to scar, though it sounds more abdominal wall/xiphoid. We discussed anatomy and options. I think the best plan for improvement is laparoscopic take down of gastroplasty with plan from removal of the band and intraluminal gastrogastric stapling. We discussed risks of scar tissue and additional dissection time or need for larger procedure. We discussed bleeding, stomach injury, or other visceral structure injury. We discussed long term issues of weight gain, change in eating habits. He showed good understanding and wanted to proceed.  ?

## 2021-09-28 NOTE — Anesthesia Postprocedure Evaluation (Signed)
Anesthesia Post Note ? ?Patient: [name] ? ?Procedure(s) Performed: LAPAROSCOPIC TAKEDOWN OF BANDED GASTROPLASTY ?UPPER GI ENDOSCOPY ?LAPAROSCOPIC LYSIS OF ADHESIONS ? ?  ? ?Patient location during evaluation: PACU ?Anesthesia Type: General ?Level of consciousness: awake and alert ?Pain management: pain level controlled ?Vital Signs Assessment: post-procedure vital signs reviewed and stable ?Respiratory status: spontaneous breathing, nonlabored ventilation, respiratory function stable and patient connected to nasal cannula oxygen ?Cardiovascular status: blood pressure returned to baseline and stable ?Postop Assessment: no apparent nausea or vomiting ?Anesthetic complications: no ? ? ?No notable events documented. ? ?Last Vitals:  ?Vitals:  ? 09/28/21 1345 09/28/21 1450  ?BP: (!) 167/80 (!) 164/78  ?Pulse: 68 66  ?Resp: 17 18  ?Temp: 36.5 ?C 36.4 ?C  ?SpO2: 99% 98%  ?  ?Last Pain:  ?Vitals:  ? 09/28/21 1450  ?TempSrc: Oral  ?PainSc:   ? ? ?  ?  ?  ?  ?  ?  ? ?Joshua Monroe ? ? ? ? ?

## 2021-09-28 NOTE — Anesthesia Preprocedure Evaluation (Addendum)
Anesthesia Evaluation  ?Patient identified by MRN, date of birth, ID band ?Patient awake ? ? ? ?Reviewed: ?Allergy & Precautions, NPO status , Patient's Chart, lab work & pertinent test results ? ?Airway ?Mallampati: I ? ?TM Distance: >3 FB ?Neck ROM: Full ? ? ? Dental ? ?(+) Teeth Intact, Dental Advisory Given ?  ?Pulmonary ? ?  ?breath sounds clear to auscultation ? ? ? ? ? ? Cardiovascular ?hypertension, Pt. on home beta blockers ? ?Rhythm:Regular Rate:Normal ? ? ?  ?Neuro/Psych ?negative neurological ROS ? negative psych ROS  ? GI/Hepatic ?GERD  Medicated,  ?Endo/Other  ?diabetes, Type 1, Insulin Dependent ? Renal/GU ?Renal disease  ? ?  ?Musculoskeletal ? ?(+) Arthritis ,  ? Abdominal ?Normal abdominal exam  (+)   ?Peds ? Hematology ?negative hematology ROS ?(+)   ?Anesthesia Other Findings ? ? Reproductive/Obstetrics ? ?  ? ? ? ? ? ? ? ? ? ? ? ? ? ?  ?  ? ? ? ? ? ? ?Anesthesia Physical ?Anesthesia Plan ? ?ASA: 2 ? ?Anesthesia Plan: General  ? ?Post-op Pain Management:   ? ?Induction: Intravenous ? ?PONV Risk Score and Plan: 3 and Ondansetron, Dexamethasone and Midazolam ? ?Airway Management Planned: Oral ETT ? ?Additional Equipment: None ? ?Intra-op Plan:  ? ?Post-operative Plan: Extubation in OR ? ?Informed Consent: I have reviewed the patients History and Physical, chart, labs and discussed the procedure including the risks, benefits and alternatives for the proposed anesthesia with the patient or authorized representative who has indicated his/her understanding and acceptance.  ? ? ? ?Dental advisory given ? ?Plan Discussed with: CRNA ? ?Anesthesia Plan Comments:   ? ? ? ? ? ?Anesthesia Quick Evaluation ? ?

## 2021-09-28 NOTE — Op Note (Signed)
Preoperative diagnosis: severe reflux ? ?Postoperative diagnosis: same  ? ?Procedure: laparoscopic gastroplasty takedown with removal of mesh, laparoscopic lysis of adhesions, upper endoscopy ? ?Surgeon: Feliciana Rossetti, M.D. ? ?Asst: Wenda Low, M.D. ? ?Anesthesia: general ? ?Indications for procedure: Joshua Monroe is a 66 y.o. year old male with symptoms of severe reflux and regurgitation. On work up he was found to have a large pouch and findings consistent with vertical banded gastroplasty. He had a history of pancreatic insufficiency induced diabetes and had undergone a bariatric procedure in 45 in [place] for diabetes control. ? ?Description of procedure: The patient was brought into the operative suite. Anesthesia was administered with General endotracheal anesthesia. WHO checklist was applied. The patient was then placed in supine position. The area was prepped and draped in the usual sterile fashion. ? ?Upper endoscopy was performed visualizing the narrowing of the gastroplasty in the anterior location of a pouch. The scope was left in place. Next, a left subcostal incision was made. A 33mm trocar was used to gain access to the peritoneal cavity by optical entry technique. Pneumoperitoneum was applied with a high flow and low pressure. The laparoscope was reinserted to confirm position. The upper abdomen had a large adhesion burden which continued to the umbilicus in the midline. A 5 mm trocar was placed in the left lateral space. Lysis of adhesions began with sharp dissection with scissors and harmonic to free the omentum from the abdominal wall. There were a few loops of small intestine also with adhesions to the abdominal wall. ? ?3 additional trocars were placed; 1 12 mm trocar in the right mid abdomen, 1 5 mm trocar in the right subcostal area, 1 5 mm trocar in the left mid abdomen, and the left subcostal trocar was upsized to a 12 mm trocar. Lysis of adhesions was continued to release the  falciform away from scar tissue and the plane between the left lobe of the liver and stomach was identified and dissected up to the diaphragm. ? ?Next, attention was turned to the stomach. There was scar in the lower cardia that was dissected free to show a firmness. Blunt and harmonic dissection was used to identify a mesh like band with blue sutures. The band was divided and a 1.5 cm portion removed away from the stomach. This was the majority of the anterior band. ? ?At this time, upper endoscopy was repeated showing a larger opening of the channel. No bubbles were seen on leak test. The channel was seen at 49 cm, the GE junction was seen at 44 cm from the teeth. There was additional depth of the pouch beyond 49 cm over the lateral aspect of the stomach. The endoscope was removed. Hemostasis was applied over the medial aspect of dissection. Pneumoperitoneum was evacuated. All incisions were closed with 4-0 monocryl subcuticular stitch. Steristrips and bandaids were put in place. The patient awoke from anesthesia and was brought to PACU in stable condition. ? ?Findings: mesh based banded gastroplasty, successful removal of anterior component of band, channel visualized at 49 cm from teeth, GE junction identified at 44 cm from teeth. ? ?Specimen: gastric mesh ? ?Implant: none  ? ?Blood loss: 50 ml ? ?Local anesthesia:  50 ml Exparel:Marcaine Mix ? ?Complications: none ? ?Feliciana Rossetti, M.D. ?General, Bariatric, & Minimally Invasive Surgery ?[facility], [place] ? ?

## 2021-09-28 NOTE — Progress Notes (Signed)
PHARMACY CONSULT FOR:  Risk Assessment for Post-Discharge VTE Following Bariatric Surgery ? ?Post-Discharge VTE Risk Assessment: ?This patient's probability of 30-day post-discharge VTE is increased due to the factors marked: ? Sleeve gastrectomy  ? Liver disorder (transplant, cirrhosis, or nonalcoholic steatohepatitis)  ? Hx of VTE  ? Hemorrhage requiring transfusion  ? GI perforation, leak, or obstruction  ? ====================================================  ?X  Male  ?X  Age >/=60 years  ?  BMI >/=50 kg/m2  ?  CHF  ?  Dyspnea at Rest  ?  Paraplegia  ?X  Non-gastric-band surgery  ?  Operation Time >/=3 hr  ?  Return to OR   ?  Length of Stay >/= 3 d  ? Hypercoagulable condition  ? Significant venous stasis  ? ? ?Predicted probability of 30-day post-discharge VTE: 0.59% MODERATE RISK ? ?Other patient-specific factors to consider: Patient actually had a takedown of a remote prior bariatric procedure, and with BMI < 30 is less representative of studied populations. Will defer ultimate decision as to whether post-discharge VTE ppx is appropriate to MD. ? ? ?Recommendation for Discharge: ?Enoxaparin 40 mg Yznaga q12h x 2 weeks post-discharge ?Case manager consulted to review insurance coverage and provide estimated patient cost ?Follow daily and recalculate estimated 30d VTE risk if returns to OR or LOS reaches 3 days. ? ? ?Joshua Monroe is a 66 y.o. male who underwent laparoscopic gastroplasty takedown with removal of mesh, laparoscopic lysis of adhesions on 09/28/21 ?  ?Case start: 0802 ?Case end: 0953 ? ? ?Allergies  ?Allergen Reactions  ? Sandostatin [Octreotide] Other (See Comments)  ?   Muscle Cramps  ? Lisinopril Other (See Comments)  ?  Unknown reaction  ? Simvastatin Other (See Comments)  ?  Muscle pain  ? ? ?Patient Measurements: ?Height: 6\' 1"  (185.4 cm) ?Weight: 102.1 kg (225 lb) ?IBW/kg (Calculated) : 79.9 ?Body mass index is 29.69 kg/m?. ? ?Recent Labs  ?  09/28/21 ?11/28/21  ?WBC 5.9  ?HGB 12.2*  ?HCT 38.8*   ?PLT 221  ?CREATININE 1.48*  ?ALBUMIN 4.1  ?PROT 7.1  ?AST 26  ?ALT 18  ?ALKPHOS 91  ?BILITOT 0.9  ? ?Estimated Creatinine Clearance: 62.5 mL/min (A) (by C-G formula based on SCr of 1.48 mg/dL (H)). ? ?Past Medical History:  ?Diagnosis Date  ? Achilles tendon injury 11/20/2014  ? Arthritis   ? Diabetes mellitus due to underlying condition (HCC) 04/24/2017  ? Essential hypertension 04/24/2017  ? Family history of adverse reaction to anesthesia   ? GERD (gastroesophageal reflux disease)   ? History of pancreatitis 1992  ? in hospital for 4 months developed DM from that  ? Hypertension   ? Pneumonia 04/2018  ? vertigo 04/24/2017  ? ? ?Medications Prior to Admission  ?Medication Sig Dispense Refill Last Dose  ? carvedilol (COREG) 6.25 MG tablet TAKE 1 TABLET(6.25 MG) BY MOUTH TWICE DAILY (Patient taking differently: Take 6.25 mg by mouth 2 (two) times daily.) 180 tablet 3 09/28/2021 at 0430  ? cloNIDine (CATAPRES) 0.1 MG tablet Take 1 tablet (0.1 mg total) by mouth at bedtime. 90 tablet 3 09/27/2021 at 2100  ? dapagliflozin propanediol (FARXIGA) 10 MG TABS tablet Take 10 mg by mouth every morning.   09/27/2021 at 0800  ? famotidine (PEPCID) 20 MG tablet TAKE 1 TABLET BY MOUTH AFTER SUPPER (Patient taking differently: Take 20 mg by mouth daily after supper.) 30 tablet 0 Past Month  ? ibuprofen (ADVIL) 200 MG tablet Take 600 mg by mouth 3 (three) times a  week. Prior to playing tennis   Past Month  ? Insulin Human (INSULIN PUMP) SOLN Inject into the skin continuous. Insulin: Lyumjev   09/28/2021  ? levothyroxine (SYNTHROID) 25 MCG tablet Take 25 mcg by mouth daily before breakfast.   09/27/2021 at 0800  ? metFORMIN (GLUCOPHAGE) 1000 MG tablet Take 1,000 mg by mouth 2 (two) times daily.   09/27/2021  ? Polyvinyl Alcohol-Povidone (REFRESH OP) Place 1 drop into both eyes daily as needed (dry eyes).   Past Week  ? RABEprazole (ACIPHEX) 20 MG tablet Take 20 mg by mouth every morning.   09/27/2021 at 0800  ? telmisartan (MICARDIS) 40 MG  tablet Take 1 tablet (40 mg total) by mouth in the morning and at bedtime. 180 tablet 3 09/27/2021 at 2100  ? TURMERIC PO Take 1,000 mg by mouth every morning.   Past Week  ? atorvastatin (LIPITOR) 80 MG tablet Take 80 mg by mouth at bedtime.   More than a month  ? ? ? ?Bernadene Person, PharmD, BCPS ?[phone] ?09/28/2021, 1:44 PM ? ? ?

## 2021-09-28 NOTE — Transfer of Care (Signed)
Immediate Anesthesia Transfer of Care Note ? ?Patient: Joshua Monroe ? ?Procedure(s) Performed: LAPAROSCOPIC TAKEDOWN OF BANDED GASTROPLASTY ?UPPER GI ENDOSCOPY ?LAPAROSCOPIC LYSIS OF ADHESIONS ? ?Patient Location: PACU ? ?Anesthesia Type:General ? ?Level of Consciousness: awake ? ?Airway & Oxygen Therapy: Patient Spontanous Breathing and Patient connected to face mask oxygen ? ?Post-op Assessment: Report given to RN and Post -op Vital signs reviewed and stable ? ?Post vital signs: Reviewed and stable ? ?Last Vitals:  ?Vitals Value Taken Time  ?BP    ?Temp    ?Pulse 63 09/28/21 1015  ?Resp 12 09/28/21 1015  ?SpO2 100 % 09/28/21 1015  ?Vitals shown include unvalidated device data. ? ?Last Pain:  ?Vitals:  ? 09/28/21 0559  ?TempSrc:   ?PainSc: 0-No pain  ?   ? ?Patients Stated Pain Goal: 4 (09/28/21 0559) ? ?Complications: No notable events documented. ?

## 2021-09-28 NOTE — Progress Notes (Addendum)
Inpatient Diabetes Program Recommendations ? ?AACE/ADA: New Consensus Statement on Inpatient Glycemic Control (2015) ? ?Target Ranges:  Prepandial:   less than 140 mg/dL ?     Peak postprandial:   less than 180 mg/dL (1-2 hours) ?     Critically ill patients:  140 - 180 mg/dL  ? ?Lab Results  ?Component Value Date  ? GLUCAP 183 (H) 09/28/2021  ? HGBA1C 7.6 (H) 08/31/2021  ? ? ?Review of Glycemic Control ? ?Diabetes history: T1DM ?Outpatient Diabetes medications: Freestyle Libre, Humalog via Medtronic insulin pump. Farxiga 10 mg QD, Metformin 1000 mg BID. Current with endo Dr. Sharl Ma ? ?Basal: ?2.15 units/H x 24 Hr (51.6 units total basal) ? ?Correction factor: ?1 units drops him 20 mg/dL ? ?Carb ratio: ?1 units for every 5 Carbs ? ?Target : ?90-130 mg/dL ? ?Active insulin time: ?3 hrs ? ?Current orders for Inpatient glycemic control: Decadron on call to OR ? ?Inpatient Diabetes Program Recommendations:   ? ?If patient is using insulin pump please place insulin pump therapy order set, print off contract and flow sheet.  Last visit with endo was 09/22/21 with Dr. Sharl Ma.  ? ?Addendum@13 :22: ? ?Spoke with  patient in PACU.  Pump has been off since prior to surgery.  He would like to use his pump while inpatient.  Obtained pump settings as shown above.  Wife has pump supplies in the car.  He can place site in an alternate spot such as back upper hip or side of abdomen.  Expect elevated CBG's due to Decadron.   ? ?He states he has used a pump for 20 years.  He does have occasional lows and treats with apple juice.  He feels low around 60 mg/dL.  He wear a Freestyle Libre which was removed prior to surgery.  Can offer him a replacement or he can call [facility] and ask for a free replacement.   ? ?He is current with Dr. Sharl Ma and sees him every 3-6 months.   ? ?Addendum@15 :52: ? ?Spoke with patient again on the phone.  He confirmed the above pump settings were changed by Dr. Sharl Ma on Thursday in anticipation for this  hospitalization/surgery.  He placed new Freestyle Libre 2 today on his arm with alarms set.   ? ?Will continue to follow while inpatient. ? ?Thank you, ?Dulce Sellar, MSN, CDCES ?Diabetes Coordinator ?Inpatient Diabetes Program ?[phone] (team pager from 8a-5p) ? ? ? ?Will continue to follow while inpatient. ? ?Thank you, ?Dulce Sellar, MSN, RN, CDCES ?Diabetes Coordinator ?Inpatient Diabetes Program ?[phone] (team pager from 8a-5p) ? ? ? ?

## 2021-09-29 ENCOUNTER — Encounter (HOSPITAL_COMMUNITY): Payer: Self-pay | Admitting: General Surgery

## 2021-09-29 DIAGNOSIS — Z4689 Encounter for fitting and adjustment of other specified devices: Secondary | ICD-10-CM | POA: Diagnosis not present

## 2021-09-29 LAB — SURGICAL PATHOLOGY

## 2021-09-29 LAB — CBC WITH DIFFERENTIAL/PLATELET
Abs Immature Granulocytes: 0.02 10*3/uL (ref 0.00–0.07)
Basophils Absolute: 0 10*3/uL (ref 0.0–0.1)
Basophils Relative: 0 %
Eosinophils Absolute: 0 10*3/uL (ref 0.0–0.5)
Eosinophils Relative: 0 %
HCT: 37.7 % — ABNORMAL LOW (ref 39.0–52.0)
Hemoglobin: 11.9 g/dL — ABNORMAL LOW (ref 13.0–17.0)
Immature Granulocytes: 0 %
Lymphocytes Relative: 12 %
Lymphs Abs: 1.1 10*3/uL (ref 0.7–4.0)
MCH: 26.4 pg (ref 26.0–34.0)
MCHC: 31.6 g/dL (ref 30.0–36.0)
MCV: 83.8 fL (ref 80.0–100.0)
Monocytes Absolute: 0.9 10*3/uL (ref 0.1–1.0)
Monocytes Relative: 10 %
Neutro Abs: 7.4 10*3/uL (ref 1.7–7.7)
Neutrophils Relative %: 78 %
Platelets: 207 10*3/uL (ref 150–400)
RBC: 4.5 MIL/uL (ref 4.22–5.81)
RDW: 15.2 % (ref 11.5–15.5)
WBC: 9.5 10*3/uL (ref 4.0–10.5)
nRBC: 0 % (ref 0.0–0.2)

## 2021-09-29 MED ORDER — ACETAMINOPHEN 500 MG PO TABS: 1000.0000 mg | ORAL_TABLET | Freq: Three times a day (TID) | ORAL | 0 refills | Status: AC

## 2021-09-29 NOTE — Progress Notes (Signed)
Inpatient Diabetes Program Recommendations ? ?AACE/ADA: New Consensus Statement on Inpatient Glycemic Control (2015) ? ?Target Ranges:  Prepandial:   less than 140 mg/dL ?     Peak postprandial:   less than 180 mg/dL (1-2 hours) ?     Critically ill patients:  140 - 180 mg/dL  ? ?Lab Results  ?Component Value Date  ? GLUCAP 180 (H) 09/28/2021  ? HGBA1C 7.6 (H) 08/31/2021  ? ? ? ?Provided him with a Freestyle Libre 2 as a replacement for the one that needed to be removed.  Current CGM reading is 128 mg/dL per patient.  He states he will be discharging this morning.   ? ?Will continue to follow while inpatient. ? ?Thank you, ?Dulce Sellar, MSN, CDCES ?Diabetes Coordinator ?Inpatient Diabetes Program ?[phone] (team pager from 8a-5p) ?   ? ? ? ? ?

## 2021-09-29 NOTE — Progress Notes (Signed)
Discharge instructions given to patient and all questions were answered.  

## 2021-09-29 NOTE — Discharge Summary (Signed)
? ?Physician Discharge Summary  ?Joshua Monroe [id number] DOB: [birth year] DOA: 09/28/2021 ? ?PCP: Orpah Melter, MD ? ?Admit date: 09/28/2021 ?Discharge date: 09/29/2021 ? ?Recommendations for Outpatient Follow-up:  ? (include homehealth, outpatient follow-up instructions, specific recommendations for PCP to follow-up on, etc.) ? ? Follow-up Information   ? ? Joshua Monroe, Joshua Bruce, MD. Go on 10/15/2021.   ?Specialty: General Surgery ?Why: at 10:50am.  Please arrive 15 minutes prior to your appointment time.  Thank you. ?Contact information: ?[address] ?[address] ?[place] [address] ?[phone] ? ? ?  ?  ? ?  ?  ? ?  ? ?Discharge Diagnoses:  ?Principal Problem: ?  S/P gastroplasty ? ? ?Surgical Procedure: laparoscopic VBG band removal, upper endoscopy ? ?Discharge Condition: Good ?Disposition: Home ? ?Diet recommendation: Postoperative sleeve gastrectomy diet (liquids only) ? ?Filed Weights  ? 09/28/21 0559  ?Weight: 102.1 kg  ? ? ? ?Hospital Course:  ?The patient was admitted after undergoing laparoscopic VBG band removal. POD 0 he ambulated well. POD 1 he was started on the water diet protocol and tolerated 400 ml in the first shift. Once meeting the water amount he was advanced to bariatric protein shakes which they tolerated and were discharged home POD 1. ? ?Treatments: surgery: laparoscopic VBG band removal ? ?Discharge Instructions ? ?Discharge Instructions   ? ? Ambulate hourly while awake   Complete by: As directed ?  ? Call MD for:  difficulty breathing, headache or visual disturbances   Complete by: As directed ?  ? Call MD for:  persistant dizziness or light-headedness   Complete by: As directed ?  ? Call MD for:  persistant nausea and vomiting   Complete by: As directed ?  ? Call MD for:  redness, tenderness, or signs of infection (pain, swelling, redness, odor or green/yellow discharge around incision site)   Complete by: As directed ?  ? Call MD for:  severe uncontrolled pain   Complete by: As  directed ?  ? Call MD for:  temperature >101 F   Complete by: As directed ?  ? Diet Carb Modified   Complete by: As directed ?  ? Discharge wound care:   Complete by: As directed ?  ? Remove Bandaids tomorrow, ok to shower tomorrow. Steristrips may fall off in 1-3 weeks.  ? Incentive spirometry   Complete by: As directed ?  ? Perform hourly while awake  ? ?  ? ?Allergies as of 09/29/2021   ? ?   Reactions  ? Sandostatin [octreotide] Other (See Comments)  ?  Muscle Cramps  ? Lisinopril Other (See Comments)  ? Unknown reaction  ? Simvastatin Other (See Comments)  ? Muscle pain  ? ?  ? ?  ?Medication List  ?  ? ?STOP taking these medications   ? ?famotidine 20 MG tablet ?Commonly known as: PEPCID ?  ?ibuprofen 200 MG tablet ?Commonly known as: ADVIL ?  ? ?  ? ?TAKE these medications   ? ?acetaminophen 500 MG tablet ?Commonly known as: TYLENOL ?Take 2 tablets (1,000 mg total) by mouth every 8 (eight) hours for 5 days. ?  ?atorvastatin 80 MG tablet ?Commonly known as: LIPITOR ?Take 80 mg by mouth at bedtime. ?  ?carvedilol 6.25 MG tablet ?Commonly known as: COREG ?TAKE 1 TABLET(6.25 MG) BY MOUTH TWICE DAILY ?What changed: See the new instructions. ?  ?cloNIDine 0.1 MG tablet ?Commonly known as: CATAPRES ?Take 1 tablet (0.1 mg total) by mouth at bedtime. ?  ?dapagliflozin propanediol 10  MG Tabs tablet ?Commonly known as: FARXIGA ?Take 10 mg by mouth every morning. ?  ?insulin pump Soln ?Inject into the skin continuous. Insulin: Lyumjev ?  ?levothyroxine 25 MCG tablet ?Commonly known as: SYNTHROID ?Take 25 mcg by mouth daily before breakfast. ?  ?metFORMIN 1000 MG tablet ?Commonly known as: GLUCOPHAGE ?Take 1,000 mg by mouth 2 (two) times daily. ?  ?RABEprazole 20 MG tablet ?Commonly known as: ACIPHEX ?Take 20 mg by mouth every morning. ?  ?REFRESH OP ?Place 1 drop into both eyes daily as needed (dry eyes). ?  ?telmisartan 40 MG tablet ?Commonly known as: MICARDIS ?Take 1 tablet (40 mg total) by mouth in the morning and at  bedtime. ?  ?TURMERIC PO ?Take 1,000 mg by mouth every morning. ?  ? ?  ? ?  ?  ? ? ?  ?Discharge Care Instructions  ?(From admission, onward)  ?  ? ? ?  ? ?  Start     Ordered  ? 09/29/21 0000  Discharge wound care:       ?Comments: Remove Bandaids tomorrow, ok to shower tomorrow. Steristrips may fall off in 1-3 weeks.  ? 09/29/21 0900  ? ?  ?  ? ?  ? ? Follow-up Information   ? ? Joshua Monroe, Joshua Bruce, MD. Go on 10/15/2021.   ?Specialty: General Surgery ?Why: at 10:50am.  Please arrive 15 minutes prior to your appointment time.  Thank you. ?Contact information: ?[address] ?[address] ?[place] [address] ?[phone] ? ? ?  ?  ? ?  ?  ? ?  ? ? ? ?The results of significant diagnostics from this hospitalization (including imaging, microbiology, ancillary and laboratory) are listed below for reference.   ? ?Significant Diagnostic Studies: ?No results found. ? ?Labs: ?Basic Metabolic Panel: ?Recent Labs  ?Lab 09/28/21 ?[id number]  ?NA 139  ?K 4.4  ?CL 110  ?CO2 21*  ?GLUCOSE 123*  ?BUN 25*  ?CREATININE 1.48*  ?CALCIUM 9.2  ? ?Liver Function Tests: ?Recent Labs  ?Lab 09/28/21 ?[id number]  ?AST 26  ?ALT 18  ?ALKPHOS 91  ?BILITOT 0.9  ?PROT 7.1  ?ALBUMIN 4.1  ? ? ?CBC: ?Recent Labs  ?Lab 09/28/21 ?[id number] 09/29/21 ?[id number]  ?WBC 5.9 9.5  ?NEUTROABS 3.7 7.4  ?HGB 12.2* 11.9*  ?HCT 38.8* 37.7*  ?MCV 87.6 83.8  ?PLT 221 207  ? ? ?CBG: ?Recent Labs  ?Lab 09/28/21 ?[id number] 09/28/21 ?1016 09/28/21 ?1324  ?GLUCAP 128* 183* 180*  ? ? ?Principal Problem: ?  S/P gastroplasty ? ? ?VTE plan: no chemical prophylaxis recommended ?([email or web address]) ? ?Time coordinating discharge: 15 min ? ?

## 2021-09-29 NOTE — Progress Notes (Signed)
24 hour chart audit completed 

## 2021-09-29 NOTE — Plan of Care (Signed)
?  Problem: Clinical Measurements: ?Goal: Will remain free from infection ?Outcome: Progressing ?  ?

## 2021-09-29 NOTE — TOC Benefit Eligibility Note (Signed)
Transition of Care (TOC) Benefit Eligibility Note  ? ? ?Patient Details  ?Name: Joshua Monroe ?MRN: [id number] ?Date of Birth: 06/05/1955 ? ? ?Medication/Dose: enoxaparin 40 mg BID x 14 days (#28 syringes) ? ?Covered?: Yes ? ?Tier: 2 Drug ? ?Prescription Coverage Preferred Pharmacy: local ? ?Spoke with Person/Company/Phone Number:: Jodie/ [facility] [employer] [phone] ? ?Co-Pay: $5.00 ? ?Prior Approval: No ? ?Deductible: Met ? ?  ? ? ? ?Fuller, Malinda ?Phone Number: ?09/29/2021, 10:30 AM ? ? ? ? ?

## 2021-09-29 NOTE — Progress Notes (Signed)
Transition of Care (TOC) Screening Note ? ?Patient Details  ?Name: Joshua Monroe ?Date of Birth: Aug 30, 1955 ? ?Transition of Care (TOC) CM/SW Contact:    ?Sherie Don, LCSW ?Phone Number: ?09/29/2021, 10:09 AM ? ?Transition of Care Department [facility]) has reviewed patient and no TOC needs have been identified at this time. We will continue to monitor patient advancement through interdisciplinary progression rounds. If new patient transition needs arise, please place a TOC consult. ?

## 2021-12-28 ENCOUNTER — Other Ambulatory Visit: Payer: Self-pay | Admitting: Cardiology

## 2022-04-05 ENCOUNTER — Other Ambulatory Visit: Payer: Self-pay | Admitting: Internal Medicine

## 2022-04-05 DIAGNOSIS — I10 Essential (primary) hypertension: Secondary | ICD-10-CM

## 2022-04-05 DIAGNOSIS — N289 Disorder of kidney and ureter, unspecified: Secondary | ICD-10-CM

## 2022-04-05 DIAGNOSIS — R3915 Urgency of urination: Secondary | ICD-10-CM

## 2022-04-12 ENCOUNTER — Ambulatory Visit
Admission: RE | Admit: 2022-04-12 | Discharge: 2022-04-12 | Disposition: A | Discharge status: Home / Self Care | Payer: Medicare Other | Source: Ambulatory Visit | Attending: Internal Medicine | Admitting: Internal Medicine

## 2022-04-12 DIAGNOSIS — N289 Disorder of kidney and ureter, unspecified: Secondary | ICD-10-CM

## 2022-04-12 DIAGNOSIS — R3915 Urgency of urination: Secondary | ICD-10-CM

## 2022-04-12 DIAGNOSIS — I10 Essential (primary) hypertension: Secondary | ICD-10-CM

## 2022-05-06 ENCOUNTER — Encounter (HOSPITAL_COMMUNITY): Payer: Self-pay | Admitting: Emergency Medicine

## 2022-05-06 ENCOUNTER — Emergency Department (HOSPITAL_COMMUNITY)
Admission: EM | Admit: 2022-05-06 | Discharge: 2022-05-06 | Disposition: A | Discharge status: Home / Self Care | Payer: Medicare Other | Attending: Emergency Medicine | Admitting: Emergency Medicine

## 2022-05-06 ENCOUNTER — Other Ambulatory Visit: Payer: Self-pay

## 2022-05-06 DIAGNOSIS — Z7984 Long term (current) use of oral hypoglycemic drugs: Secondary | ICD-10-CM | POA: Insufficient documentation

## 2022-05-06 DIAGNOSIS — R112 Nausea with vomiting, unspecified: Secondary | ICD-10-CM | POA: Insufficient documentation

## 2022-05-06 DIAGNOSIS — R109 Unspecified abdominal pain: Secondary | ICD-10-CM | POA: Diagnosis present

## 2022-05-06 DIAGNOSIS — R1013 Epigastric pain: Secondary | ICD-10-CM | POA: Diagnosis not present

## 2022-05-06 DIAGNOSIS — E119 Type 2 diabetes mellitus without complications: Secondary | ICD-10-CM | POA: Insufficient documentation

## 2022-05-06 DIAGNOSIS — I10 Essential (primary) hypertension: Secondary | ICD-10-CM | POA: Insufficient documentation

## 2022-05-06 LAB — COMPREHENSIVE METABOLIC PANEL
ALT: 23 U/L (ref 0–44)
AST: 23 U/L (ref 15–41)
Albumin: 4.5 g/dL (ref 3.5–5.0)
Alkaline Phosphatase: 126 U/L (ref 38–126)
Anion gap: 6 (ref 5–15)
BUN: 32 mg/dL — ABNORMAL HIGH (ref 8–23)
CO2: 22 mmol/L (ref 22–32)
Calcium: 9.4 mg/dL (ref 8.9–10.3)
Chloride: 113 mmol/L — ABNORMAL HIGH (ref 98–111)
Creatinine, Ser: 1.82 mg/dL — ABNORMAL HIGH (ref 0.61–1.24)
GFR, Estimated: 41 mL/min — ABNORMAL LOW (ref >60)
Glucose, Bld: 133 mg/dL — ABNORMAL HIGH (ref 70–99)
Potassium: 4 mmol/L (ref 3.5–5.1)
Sodium: 141 mmol/L (ref 135–145)
Total Bilirubin: 0.7 mg/dL (ref 0.3–1.2)
Total Protein: 8 g/dL (ref 6.5–8.1)

## 2022-05-06 LAB — CBC WITH DIFFERENTIAL/PLATELET
Abs Immature Granulocytes: 0.03 10*3/uL (ref 0.00–0.07)
Basophils Absolute: 0.1 10*3/uL (ref 0.0–0.1)
Basophils Relative: 1 %
Eosinophils Absolute: 0.2 10*3/uL (ref 0.0–0.5)
Eosinophils Relative: 2 %
HCT: 46.4 % (ref 39.0–52.0)
Hemoglobin: 13.9 g/dL (ref 13.0–17.0)
Immature Granulocytes: 0 %
Lymphocytes Relative: 16 %
Lymphs Abs: 1.5 10*3/uL (ref 0.7–4.0)
MCH: 25.9 pg — ABNORMAL LOW (ref 26.0–34.0)
MCHC: 30 g/dL (ref 30.0–36.0)
MCV: 86.6 fL (ref 80.0–100.0)
Monocytes Absolute: 0.8 10*3/uL (ref 0.1–1.0)
Monocytes Relative: 9 %
Neutro Abs: 6.6 10*3/uL (ref 1.7–7.7)
Neutrophils Relative %: 72 %
Platelets: 240 10*3/uL (ref 150–400)
RBC: 5.36 MIL/uL (ref 4.22–5.81)
RDW: 17.1 % — ABNORMAL HIGH (ref 11.5–15.5)
WBC: 9.1 10*3/uL (ref 4.0–10.5)
nRBC: 0 % (ref 0.0–0.2)

## 2022-05-06 LAB — LIPASE, BLOOD: Lipase: 25 U/L (ref 11–51)

## 2022-05-06 MED ORDER — LACTATED RINGERS IV BOLUS
1000.0000 mL | Freq: Once | INTRAVENOUS | Status: AC
  Administered 2022-05-06: 1000 mL via INTRAVENOUS

## 2022-05-06 MED ORDER — ONDANSETRON HCL 4 MG/2ML IJ SOLN
4.0000 mg | Freq: Once | INTRAMUSCULAR | Status: AC
  Administered 2022-05-06: 4 mg via INTRAVENOUS
  Filled 2022-05-06: qty 2

## 2022-05-06 MED ORDER — MORPHINE SULFATE (PF) 4 MG/ML IV SOLN
4.0000 mg | Freq: Once | INTRAVENOUS | Status: DC
  Filled 2022-05-06: qty 1

## 2022-05-06 MED ORDER — ONDANSETRON HCL 4 MG PO TABS: 4.0000 mg | ORAL_TABLET | Freq: Four times a day (QID) | ORAL | 0 refills | Status: DC | PRN

## 2022-05-06 NOTE — Discharge Instructions (Addendum)
You were seen in the emergency room today for evaluation of your nausea, vomiting, diarrhea, and abdominal pain.  We decided on not doing a CT scan of your abdomen.  If you already have any increased pain, nausea, vomiting, or diarrhea, black or bloody stools, black or bloody emesis, fevers, urinary symptoms, chest pain, shortness of breath, please return to the nearest emergency department for evaluation of this.  Otherwise, would like for you to follow-up with your endocrinologist.  If you have any concerns, new or worsening symptoms, please return to the nearest emergency room for evaluation.  Contact a doctor if: Your belly pain changes or gets worse. You are not hungry, or you lose weight without trying. You are having trouble pooping (constipated) or have watery poop (diarrhea) for more than 2-3 days. You have pain when you pee or poop. Your belly pain wakes you up at night. Your pain gets worse with meals, after eating, or with certain foods. You are vomiting and cannot keep anything down. You have a fever. You have blood in your pee. Get help right away if: Your pain does not go away as soon as your doctor says it should. You cannot stop vomiting. Your pain is only in areas of your belly, such as the right side or the left lower part of the belly. You have bloody or black poop, or poop that looks like tar. You have very bad pain, cramping, or bloating in your belly. You have signs of not having enough fluid or water in your body (dehydration), such as: Dark pee, very little pee, or no pee. Cracked lips. Dry mouth. Sunken eyes. Sleepiness. Weakness. You have trouble breathing or chest pain.

## 2022-05-06 NOTE — ED Provider Triage Note (Signed)
Emergency Medicine Provider Triage Evaluation Note  Joshua Monroe , a 66 y.o. male  was evaluated in triage.  Pt complains of diarrhea for the past few weeks that improved, but then came back and worsened. Started to have vomiting last night with belly pain and abdominal distention. He has a h/o familial pancreatitis and was sent over here from [facility]. Denies any CGE, hematemesis, hematochezia, or melena.  Review of Systems  Positive:  Negative:   Physical Exam  BP 115/63 (BP Location: Left Arm)   Pulse 66   Temp 97.6 F (36.4 C) (Oral)   Resp 18   Ht 6\' 1"  (1.854 m)   Wt 103 kg   SpO2 97%   BMI 29.95 kg/m  Gen:   Awake, no distress   Resp:  Normal effort  MSK:   Moves extremities without difficulty  Other:  Mild abdominal distention with upper abdominal tenderness. Abdomen is soft.  Medical Decision Making  Medically screening exam initiated at 1:45 PM.  Appropriate orders placed.  Joshua Monroe was informed that the remainder of the evaluation will be completed by another provider, this initial triage assessment does not replace that evaluation, and the importance of remaining in the ED until their evaluation is complete.  Discussed this case with my attending. Will hold off on any imaging until the patient can be further evaluated in the back.    , PA-C 05/06/22 1347

## 2022-05-06 NOTE — ED Triage Notes (Signed)
Pt reports diarrhea, nausea, vomiting, and abdominal pain. Pt was sent by PCP for pancreatitis flare up.

## 2022-05-06 NOTE — ED Provider Notes (Signed)
[facility]-EMERGENCY DEPT Provider Note   CSN: [id number] Arrival date & time: 05/06/22  1241     History Chief Complaint  Patient presents with   Abdominal Pain    Mekel Haverstock is a 66 y.o. male with history of diabetes, hypertension, GERD, familial pancreatitis presents the emergency room today for evaluation of nausea, vomiting, and diarrhea with his upper abdominal pain.  Patient reports that this feels like his   Abdominal Pain      Home Medications Prior to Admission medications   Medication Sig Start Date End Date Taking? Authorizing Provider  atorvastatin (LIPITOR) 80 MG tablet Take 80 mg by mouth at bedtime. 07/20/19   [provider]  carvedilol (COREG) 6.25 MG tablet TAKE 1 TABLET(6.25 MG) BY MOUTH TWICE DAILY Patient taking differently: Take 6.25 mg by mouth 2 (two) times daily. 07/12/21   Baldo Daub, MD  cloNIDine (CATAPRES) 0.1 MG tablet Take 1 tablet (0.1 mg total) by mouth at bedtime. 09/07/21   Baldo Daub, MD  dapagliflozin propanediol (FARXIGA) 10 MG TABS tablet Take 10 mg by mouth every morning.    [provider]  Insulin Human (INSULIN PUMP) SOLN Inject into the skin continuous. Insulin: Lyumjev    [provider]  levothyroxine (SYNTHROID) 25 MCG tablet Take 25 mcg by mouth daily before breakfast. 01/16/21   [provider]  metFORMIN (GLUCOPHAGE) 1000 MG tablet Take 1,000 mg by mouth 2 (two) times daily. 07/03/14   [provider]  Polyvinyl Alcohol-Povidone (REFRESH OP) Place 1 drop into both eyes daily as needed (dry eyes).    [provider]  RABEprazole (ACIPHEX) 20 MG tablet Take 20 mg by mouth every morning.    [provider]  telmisartan (MICARDIS) 40 MG tablet TAKE 1 TABLET BY MOUTH EVERY MORNING AND EVERY NIGHT AT BEDTIME 12/28/21   Baldo Daub, MD  TURMERIC PO Take 1,000 mg by mouth every morning.    [provider]      Allergies     Sandostatin [octreotide], Lisinopril, and Simvastatin    Review of Systems   Review of Systems  Gastrointestinal:  Positive for abdominal pain.    Physical Exam Updated Vital Signs BP (!) 142/73   Pulse (!) 58   Temp 97.6 F (36.4 C) (Oral)   Resp 18   Ht 6\' 1"  (1.854 m)   Wt 103 kg   SpO2 94%   BMI 29.95 kg/m  Physical Exam  ED Results / Procedures / Treatments   Labs (all labs ordered are listed, but only abnormal results are displayed) Labs Reviewed  CBC WITH DIFFERENTIAL/PLATELET - Abnormal; Notable for the following components:      Result Value   MCH 25.9 (*)    RDW 17.1 (*)    All other components within normal limits  COMPREHENSIVE METABOLIC PANEL - Abnormal; Notable for the following components:   Chloride 113 (*)    Glucose, Bld 133 (*)    BUN 32 (*)    Creatinine, Ser 1.82 (*)    GFR, Estimated 41 (*)    All other components within normal limits  LIPASE, BLOOD  URINALYSIS, ROUTINE W REFLEX MICROSCOPIC    EKG None  Radiology No results found.  Procedures Procedures   Medications Ordered in ED Medications  morphine (PF) 4 MG/ML injection 4 mg (4 mg Intravenous Patient Refused/Not Given 05/06/22 1819)  lactated ringers bolus 1,000 mL (1,000 mLs Intravenous New Bag/Given 05/06/22 1819)  ondansetron (ZOFRAN) injection  4 mg (4 mg Intravenous Given 05/06/22 1819)    ED Course/ Medical Decision Making/ A&P                           Medical Decision Making Amount and/or Complexity of Data Reviewed Labs: ordered.  Risk Prescription drug management.   66 year old male presents the emergency room today for evaluation of pancreatitis flare.  Differential diagnosis includes limited to pancreatitis, electrolyte abnormality, dehydration, colitis, diverticulitis, C. difficile, small bowel obstruction.  Vital signs show mildly elevated blood pressure 142/73 otherwise afebrile, normal heart rate, satting well on room air without increased work of  breathing.  Physical exam as noted above.  Lab work ordered.  I had a shared decision making with the patient coming around the room about doing CT scanning versus not doing any CT scanning.  Since patient reports that this feels exact like his pancreatitis flareup and is one of the more mild episodes he has had.  We discussed doing a CT scan versus holding off given his mild amount of pain.  Patient would like to proceed with not doing a CT scan at this time.  We discussed that he can come back at any time to receive this if his pain worsens or symptoms worsens.  I independently reviewed and interpreted the patient's labs.  Lipase within normal limits.  CBC without leukocytosis or anemia.  CMP shows mildly elevated chloride at 113.  Mildly elevated glucose at 133.  Creatinine at 1.82 which is slightly elevated from patient's previous 2 years ago at 1.48.  Patient was given 1 L of LR and Zofran.  He declined any pain medications.  On reevaluation, patient's reports that he is feeling much better would like to go home.  I will send him home with some Zofran and have him follow-up with his endocrinologist.  We discussed his lab findings.  Discussed return precautions red flag symptoms.  Patient verbalizes understanding agrees the plan.  Patient is stable be discharged home in good condition.  I discussed this case with my attending physician who cosigned this note including patient's presenting symptoms, physical exam, and planned diagnostics and interventions. Attending physician stated agreement with plan or made changes to plan which were implemented.   Final Clinical Impression(s) / ED Diagnoses Final diagnoses:  Epigastric pain  Nausea vomiting and diarrhea    Rx / DC Orders ED Discharge Orders     None

## 2022-05-31 NOTE — Progress Notes (Unsigned)
Cardiology Office Note:    Date:  05/31/2022   ID:  Joshua [place], DOB December 15, 1955, MRN [id number]  PCP:  Joycelyn Rua, MD  Cardiologist:  Norman Herrlich, MD    Referring MD: Joycelyn Rua, MD    ASSESSMENT:    No diagnosis found. PLAN:    In order of problems listed above:  ***   Next appointment: ***   Medication Adjustments/Labs and Tests Ordered: Current medicines are reviewed at length with the patient today.  Concerns regarding medicines are outlined above.  No orders of the defined types were placed in this encounter.  No orders of the defined types were placed in this encounter.   No chief complaint on file.   History of Present Illness:    Joshua [facility] is a 67 y.o. male with a hx of type 2 diabetes with syncope due to hypoglycemia bronchiectasis and hypertension last seen 09/07/2021.  In the interim he was seen by [facility] for his stage IIIa hypertension Compliance with diet, lifestyle and medications: *** Past Medical History:  Diagnosis Date   Achilles tendon injury 11/20/2014   Arthritis    Diabetes mellitus due to underlying condition (HCC) 04/24/2017   Essential hypertension 04/24/2017   Family history of adverse reaction to anesthesia    GERD (gastroesophageal reflux disease)    History of pancreatitis 1992   in hospital for 4 months developed DM from that   Hypertension    Pneumonia 04/2018   vertigo 04/24/2017    Past Surgical History:  Procedure Laterality Date   achilles tendon Right 2013   ACHILLES TENDON SURGERY Left 11/20/2014   Procedure: OPEN REPAIR OF A LEFT ACHILLES TENDON RUPTURE;  Surgeon: Ranee Gosselin, MD;  Location: [facility];  Service: Orthopedics;  Laterality: Left;   APPENDECTOMY     age 64   CHOLECYSTECTOMY     LAPAROSCOPIC LYSIS OF ADHESIONS N/A 09/28/2021   Procedure: LAPAROSCOPIC LYSIS OF ADHESIONS;  Surgeon: Kinsinger, De Blanch, MD;  Location: [facility];  Service: General;  Laterality: N/A;    TONSILLECTOMY     UPPER GI ENDOSCOPY N/A 09/28/2021   Procedure: UPPER GI ENDOSCOPY;  Surgeon: Sheliah Hatch, De Blanch, MD;  Location: [facility];  Service: General;  Laterality: N/A;    Current Medications: No outpatient medications have been marked as taking for the 06/01/22 encounter (Appointment) with Baldo Daub, MD.     Allergies:   Sandostatin [octreotide], Lisinopril, and Simvastatin   Social History   Socioeconomic History   Marital status: Married    Spouse name: Not on file   Number of children: 3   Years of education: Not on file   Highest education level: Not on file  Occupational History   Not on file  Tobacco Use   Smoking status: Never   Smokeless tobacco: Never  Vaping Use   Vaping Use: Never used  Substance and Sexual Activity   Alcohol use: Yes    Comment: rare   Drug use: No   Sexual activity: Not on file  Other Topics Concern   Not on file  Social History Narrative   Right Handed    Lives in a one story home    Drinks tea   Social Determinants of Health   Financial Resource Strain: Not on file  Food Insecurity: Not on file  Transportation Needs: Not on file  Physical Activity: Not on file  Stress: Not on file  Social Connections: Not on file     Family History: The  patient's ***family history includes Diabetes (age of onset: 24) in his mother; Healthy in his son. ROS:   Please see the history of present illness.    All other systems reviewed and are negative.  EKGs/Labs/Other Studies Reviewed:    The following studies were reviewed today:  EKG:  EKG ordered today and personally reviewed.  The ekg ordered today demonstrates ***  Recent Labs: 05/06/2022: ALT 23; BUN 32; Creatinine, Ser 1.82; Hemoglobin 13.9; Platelets 240; Potassium 4.0; Sodium 141  Recent Lipid Panel No results found for: "CHOL", "TRIG", "HDL", "CHOLHDL", "VLDL", "LDLCALC", "LDLDIRECT"  Physical Exam:    VS:  There were no vitals taken for this visit.    Wt Readings  from Last 3 Encounters:  05/06/22 227 lb (103 kg)  09/28/21 225 lb (102.1 kg)  09/07/21 225 lb (102.1 kg)     GEN: *** Well nourished, well developed in no acute distress HEENT: Normal NECK: No JVD; No carotid bruits LYMPHATICS: No lymphadenopathy CARDIAC: ***RRR, no murmurs, rubs, gallops RESPIRATORY:  Clear to auscultation without rales, wheezing or rhonchi  ABDOMEN: Soft, non-tender, non-distended MUSCULOSKELETAL:  No edema; No deformity  SKIN: Warm and dry NEUROLOGIC:  Alert and oriented x 3 PSYCHIATRIC:  Normal affect    Signed, Shirlee More, MD  05/31/2022 5:30 PM    [place]

## 2022-06-01 ENCOUNTER — Encounter: Payer: Self-pay | Admitting: Cardiology

## 2022-06-01 ENCOUNTER — Ambulatory Visit: Payer: Medicare Other | Attending: Cardiology | Admitting: Cardiology

## 2022-06-01 VITALS — BP 130/70 | HR 62 | Wt 228.0 lb

## 2022-06-01 DIAGNOSIS — N183 Chronic kidney disease, stage 3 unspecified: Secondary | ICD-10-CM | POA: Insufficient documentation

## 2022-06-01 DIAGNOSIS — I129 Hypertensive chronic kidney disease with stage 1 through stage 4 chronic kidney disease, or unspecified chronic kidney disease: Secondary | ICD-10-CM | POA: Insufficient documentation

## 2022-06-01 DIAGNOSIS — E119 Type 2 diabetes mellitus without complications: Secondary | ICD-10-CM | POA: Diagnosis not present

## 2022-06-01 NOTE — Patient Instructions (Signed)
Medication Instructions:  Your physician recommends that you continue on your current medications as directed. Please refer to the Current Medication list given to you today.  *If you need a refill on your cardiac medications before your next appointment, please call your pharmacy*   Lab Work: None If you have labs (blood work) drawn today and your tests are completely normal, you will receive your results only by: MyChart Message (if you have MyChart) OR A paper copy in the mail If you have any lab test that is abnormal or we need to change your treatment, we will call you to review the results.   Testing/Procedures: None   Follow-Up: At [facility], you and your health needs are our priority.  As part of our continuing mission to provide you with exceptional heart care, we have created designated Provider Care Teams.  These Care Teams include your primary Cardiologist (physician) and Advanced Practice Providers (APPs -  Physician Assistants and Nurse Practitioners) who all work together to provide you with the care you need, when you need it.  We recommend signing up for the patient portal called "MyChart".  Sign up information is provided on this After Visit Summary.  MyChart is used to connect with patients for Virtual Visits (Telemedicine).  Patients are able to view lab/test results, encounter notes, upcoming appointments, etc.  Non-urgent messages can be sent to your provider as well.   To learn more about what you can do with MyChart, go to [email or web address].    Your next appointment:   6 month(s)  The format for your next appointment:   In Person  Provider:   Brian Munley, MD    Other Instructions None  Important Information About Sugar       

## 2022-07-20 ENCOUNTER — Telehealth: Payer: Self-pay

## 2022-07-20 ENCOUNTER — Other Ambulatory Visit: Payer: Self-pay

## 2022-07-20 MED ORDER — VALSARTAN 160 MG PO TABS: 160.0000 mg | ORAL_TABLET | Freq: Every day | ORAL | 3 refills | Status: DC

## 2022-07-20 NOTE — Telephone Encounter (Signed)
Received a message from Dr. Bettina Gavia to switch the patient from Telmisartan to Valsartan 160 mg daily. Called the patient and informed him of the change. Patient was appreciative for the call and had no further questions at this time.

## 2022-10-11 ENCOUNTER — Other Ambulatory Visit: Payer: Self-pay | Admitting: Cardiology

## 2022-11-11 ENCOUNTER — Other Ambulatory Visit: Payer: Self-pay | Admitting: Internal Medicine

## 2022-11-11 DIAGNOSIS — R109 Unspecified abdominal pain: Secondary | ICD-10-CM

## 2022-11-11 DIAGNOSIS — Z8719 Personal history of other diseases of the digestive system: Secondary | ICD-10-CM

## 2022-11-16 ENCOUNTER — Ambulatory Visit
Admission: RE | Admit: 2022-11-16 | Discharge: 2022-11-16 | Disposition: A | Discharge status: Home / Self Care | Payer: Medicare Other | Source: Ambulatory Visit | Attending: Internal Medicine | Admitting: Internal Medicine

## 2022-11-16 DIAGNOSIS — Z8719 Personal history of other diseases of the digestive system: Secondary | ICD-10-CM

## 2022-11-16 DIAGNOSIS — R109 Unspecified abdominal pain: Secondary | ICD-10-CM

## 2022-12-13 ENCOUNTER — Other Ambulatory Visit: Payer: Self-pay

## 2022-12-13 DIAGNOSIS — R413 Other amnesia: Secondary | ICD-10-CM

## 2022-12-13 DIAGNOSIS — K2930 Chronic superficial gastritis without bleeding: Secondary | ICD-10-CM | POA: Insufficient documentation

## 2022-12-13 DIAGNOSIS — G894 Chronic pain syndrome: Secondary | ICD-10-CM | POA: Insufficient documentation

## 2022-12-13 DIAGNOSIS — E063 Autoimmune thyroiditis: Secondary | ICD-10-CM

## 2022-12-13 DIAGNOSIS — K8689 Other specified diseases of pancreas: Secondary | ICD-10-CM | POA: Insufficient documentation

## 2022-12-13 DIAGNOSIS — Z9049 Acquired absence of other specified parts of digestive tract: Secondary | ICD-10-CM | POA: Insufficient documentation

## 2022-12-13 DIAGNOSIS — N529 Male erectile dysfunction, unspecified: Secondary | ICD-10-CM | POA: Insufficient documentation

## 2022-12-13 DIAGNOSIS — E039 Hypothyroidism, unspecified: Secondary | ICD-10-CM

## 2022-12-13 DIAGNOSIS — Z8719 Personal history of other diseases of the digestive system: Secondary | ICD-10-CM

## 2022-12-13 DIAGNOSIS — R1013 Epigastric pain: Secondary | ICD-10-CM | POA: Insufficient documentation

## 2022-12-13 DIAGNOSIS — K56609 Unspecified intestinal obstruction, unspecified as to partial versus complete obstruction: Secondary | ICD-10-CM | POA: Insufficient documentation

## 2022-12-13 DIAGNOSIS — K219 Gastro-esophageal reflux disease without esophagitis: Secondary | ICD-10-CM | POA: Insufficient documentation

## 2022-12-13 DIAGNOSIS — K317 Polyp of stomach and duodenum: Secondary | ICD-10-CM | POA: Insufficient documentation

## 2022-12-13 DIAGNOSIS — Z794 Long term (current) use of insulin: Secondary | ICD-10-CM | POA: Insufficient documentation

## 2022-12-13 DIAGNOSIS — D509 Iron deficiency anemia, unspecified: Secondary | ICD-10-CM | POA: Insufficient documentation

## 2022-12-13 HISTORY — DX: Male erectile dysfunction, unspecified: N52.9

## 2022-12-13 HISTORY — DX: Personal history of other diseases of the digestive system: Z87.19

## 2022-12-13 HISTORY — DX: Epigastric pain: R10.13

## 2022-12-13 HISTORY — DX: Other specified diseases of pancreas: K86.89

## 2022-12-13 HISTORY — DX: Chronic superficial gastritis without bleeding: K29.30

## 2022-12-13 HISTORY — DX: Chronic pain syndrome: G89.4

## 2022-12-13 HISTORY — DX: Acquired absence of other specified parts of digestive tract: Z90.49

## 2022-12-13 HISTORY — DX: Long term (current) use of insulin: Z79.4

## 2022-12-13 HISTORY — DX: Autoimmune thyroiditis: E06.3

## 2022-12-13 HISTORY — DX: Hypothyroidism, unspecified: E03.9

## 2022-12-13 HISTORY — DX: Polyp of stomach and duodenum: K31.7

## 2022-12-13 HISTORY — DX: Iron deficiency anemia, unspecified: D50.9

## 2022-12-13 HISTORY — DX: Gastro-esophageal reflux disease without esophagitis: K21.9

## 2022-12-13 HISTORY — DX: Other amnesia: R41.3
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
## 2022-12-14 ENCOUNTER — Encounter: Payer: Self-pay | Admitting: Cardiology

## 2022-12-14 ENCOUNTER — Ambulatory Visit: Payer: Medicare Other | Attending: Cardiology | Admitting: Cardiology

## 2022-12-14 ENCOUNTER — Other Ambulatory Visit: Payer: Self-pay | Admitting: Cardiology

## 2022-12-14 VITALS — BP 144/76 | HR 57 | Ht 73.0 in | Wt 231.4 lb

## 2022-12-14 DIAGNOSIS — N183 Chronic kidney disease, stage 3 unspecified: Secondary | ICD-10-CM

## 2022-12-14 DIAGNOSIS — R0989 Other specified symptoms and signs involving the circulatory and respiratory systems: Secondary | ICD-10-CM

## 2022-12-14 DIAGNOSIS — E119 Type 2 diabetes mellitus without complications: Secondary | ICD-10-CM | POA: Insufficient documentation

## 2022-12-14 DIAGNOSIS — I129 Hypertensive chronic kidney disease with stage 1 through stage 4 chronic kidney disease, or unspecified chronic kidney disease: Secondary | ICD-10-CM | POA: Insufficient documentation

## 2022-12-14 NOTE — Patient Instructions (Signed)
Medication Instructions:  Your physician recommends that you continue on your current medications as directed. Please refer to the Current Medication list given to you today.  *If you need a refill on your cardiac medications before your next appointment, please call your pharmacy*   Lab Work: None If you have labs (blood work) drawn today and your tests are completely normal, you will receive your results only by: MyChart Message (if you have MyChart) OR A paper copy in the mail If you have any lab test that is abnormal or we need to change your treatment, we will call you to review the results.   Testing/Procedures: Your physician has requested that you have a carotid duplex. This test is an ultrasound of the carotid arteries in your neck. It looks at blood flow through these arteries that supply the brain with blood. Allow one hour for this exam. There are no restrictions or special instructions.    Follow-Up: At [facility], you and your health needs are our priority.  As part of our continuing mission to provide you with exceptional heart care, we have created designated Provider Care Teams.  These Care Teams include your primary Cardiologist (physician) and Advanced Practice Providers (APPs -  Physician Assistants and Nurse Practitioners) who all work together to provide you with the care you need, when you need it.  We recommend signing up for the patient portal called "MyChart".  Sign up information is provided on this After Visit Summary.  MyChart is used to connect with patients for Virtual Visits (Telemedicine).  Patients are able to view lab/test results, encounter notes, upcoming appointments, etc.  Non-urgent messages can be sent to your provider as well.   To learn more about what you can do with MyChart, go to [email or web address].    Your next appointment:   6 month(s)  Provider:   Brian Munley, MD    Other Instructions None . 

## 2022-12-15 LAB — LAB REPORT - SCANNED: EGFR: 61

## 2022-12-16 ENCOUNTER — Ambulatory Visit (HOSPITAL_COMMUNITY)
Admission: RE | Admit: 2022-12-16 | Discharge: 2022-12-16 | Disposition: A | Discharge status: Home / Self Care | Payer: Medicare Other | Source: Ambulatory Visit | Attending: Cardiovascular Disease | Admitting: Cardiovascular Disease

## 2022-12-16 DIAGNOSIS — R0989 Other specified symptoms and signs involving the circulatory and respiratory systems: Secondary | ICD-10-CM | POA: Diagnosis not present

## 2023-01-30 ENCOUNTER — Other Ambulatory Visit: Payer: Self-pay | Admitting: Cardiology

## 2023-02-01 ENCOUNTER — Other Ambulatory Visit: Payer: Self-pay | Admitting: Cardiology

## 2023-03-09 DIAGNOSIS — M542 Cervicalgia: Secondary | ICD-10-CM | POA: Insufficient documentation

## 2023-04-10 DIAGNOSIS — S52132A Displaced fracture of neck of left radius, initial encounter for closed fracture: Secondary | ICD-10-CM | POA: Insufficient documentation

## 2023-04-10 DIAGNOSIS — M65322 Trigger finger, left index finger: Secondary | ICD-10-CM | POA: Insufficient documentation

## 2023-04-13 ENCOUNTER — Other Ambulatory Visit: Payer: Self-pay | Admitting: Cardiology

## 2023-04-13 DIAGNOSIS — I129 Hypertensive chronic kidney disease with stage 1 through stage 4 chronic kidney disease, or unspecified chronic kidney disease: Secondary | ICD-10-CM

## 2023-04-13 MED ORDER — TELMISARTAN 80 MG PO TABS: 80.0000 mg | ORAL_TABLET | Freq: Every day | ORAL | 3 refills | Status: DC

## 2023-05-02 DIAGNOSIS — M65341 Trigger finger, right ring finger: Secondary | ICD-10-CM | POA: Insufficient documentation

## 2023-05-02 DIAGNOSIS — M65941 Unspecified synovitis and tenosynovitis, right hand: Secondary | ICD-10-CM | POA: Insufficient documentation

## 2023-07-08 ENCOUNTER — Other Ambulatory Visit: Payer: Self-pay | Admitting: Cardiology

## 2023-07-08 DIAGNOSIS — N183 Chronic kidney disease, stage 3 unspecified: Secondary | ICD-10-CM

## 2023-07-10 ENCOUNTER — Encounter: Payer: Self-pay | Admitting: *Deleted

## 2023-07-10 NOTE — Telephone Encounter (Signed)
 Rx refill sent to pharmacy.

## 2023-09-08 ENCOUNTER — Other Ambulatory Visit: Payer: Self-pay | Admitting: Cardiology

## 2023-11-18 ENCOUNTER — Other Ambulatory Visit: Payer: Self-pay | Admitting: Cardiology

## 2023-11-18 DIAGNOSIS — N183 Chronic kidney disease, stage 3 unspecified: Secondary | ICD-10-CM

## 2023-11-20 NOTE — Telephone Encounter (Signed)
 Rx refill sent to pharmacy.

## 2023-12-06 ENCOUNTER — Telehealth: Payer: Self-pay

## 2023-12-06 NOTE — Telephone Encounter (Signed)
...     Pre-operative Risk Assessment    Patient Name: Joshua [place]  DOB: [birth year] MRN: [id number]   Date of last office visit: 04-13-23 Date of next office visit: NONE   Request for Surgical Clearance    Procedure:  RIGHT KNEE OPEN QUAD TENDON DEB AND REPAIR  Date of Surgery:  Clearance TBD                                Surgeon:  DR SELINDA GOSLING Surgeon's Group or Practice Name:  [facility] Phone number:  [phone] Fax number:  [phone]   Type of Clearance Requested:   - Medical    Type of Anesthesia:  CHOICE   Additional requests/questions:    Bonney Teressa Rumalda Ronal   12/06/2023, 1:22 PM

## 2023-12-06 NOTE — Telephone Encounter (Signed)
 Patient has been scheduled for IN OFFICE VISIT

## 2023-12-06 NOTE — Telephone Encounter (Signed)
   Name: Joshua [place]  DOB: 22-Feb-1956  MRN: [id number]  Primary Cardiologist: Mon[place]  Chart reviewed as part of pre-operative protocol coverage. Because of [name]'s past medical history and time since last visit, he will require a follow-up in-office visit in order to better assess preoperative cardiovascular risk. Last seen by Dr. [place]etta 12/14/2022  Pre-op covering staff: - Please schedule appointment and call patient to inform them. If patient already had an upcoming appointment within acceptable timeframe, please add pre-op clearance to the appointment notes so provider is aware. - Please contact requesting surgeon's office via preferred method (i.e, phone, fax) to inform them of need for appointment prior to surgery.    [name], NP  12/06/2023, 1:45 PM

## 2023-12-13 ENCOUNTER — Ambulatory Visit: Attending: Cardiology | Admitting: Cardiology

## 2023-12-13 ENCOUNTER — Encounter: Payer: Self-pay | Admitting: Cardiology

## 2023-12-13 VITALS — BP 154/80 | HR 72 | Ht 73.0 in | Wt 242.0 lb

## 2023-12-13 DIAGNOSIS — I10 Essential (primary) hypertension: Secondary | ICD-10-CM | POA: Insufficient documentation

## 2023-12-13 DIAGNOSIS — E785 Hyperlipidemia, unspecified: Secondary | ICD-10-CM | POA: Insufficient documentation

## 2023-12-13 DIAGNOSIS — E782 Mixed hyperlipidemia: Secondary | ICD-10-CM | POA: Diagnosis present

## 2023-12-13 DIAGNOSIS — N183 Chronic kidney disease, stage 3 unspecified: Secondary | ICD-10-CM | POA: Diagnosis present

## 2023-12-13 DIAGNOSIS — E119 Type 2 diabetes mellitus without complications: Secondary | ICD-10-CM

## 2023-12-13 DIAGNOSIS — I2510 Atherosclerotic heart disease of native coronary artery without angina pectoris: Secondary | ICD-10-CM | POA: Diagnosis present

## 2023-12-13 DIAGNOSIS — I129 Hypertensive chronic kidney disease with stage 1 through stage 4 chronic kidney disease, or unspecified chronic kidney disease: Secondary | ICD-10-CM | POA: Diagnosis not present

## 2023-12-13 MED ORDER — TELMISARTAN 40 MG PO TABS: 40.0000 mg | ORAL_TABLET | Freq: Every day | ORAL | 3 refills | Status: DC

## 2023-12-13 NOTE — Patient Instructions (Signed)
 Medication Instructions:  Your physician has recommended you make the following change in your medication:   START: Telmisartan  40 mg daily  *If you need a refill on your cardiac medications before your next appointment, please call your pharmacy*  Lab Work: Your physician recommends that you return for lab work in:   Labs in 2 weeks: BMP  If you have labs (blood work) drawn today and your tests are completely normal, you will receive your results only by: MyChart Message (if you have MyChart) OR A paper copy in the mail If you have any lab test that is abnormal or we need to change your treatment, we will call you to review the results.  Testing/Procedures: None  Follow-Up: At [facility], you and your health needs are our priority.  As part of our continuing mission to provide you with exceptional heart care, our providers are all part of one team.  This team includes your primary Cardiologist (physician) and Advanced Practice Providers or APPs (Physician Assistants and Nurse Practitioners) who all work together to provide you with the care you need, when you need it.  Your next appointment:   1 year(s)  Provider:   Redell Leiter, MD    We recommend signing up for the patient portal called MyChart.  Sign up information is provided on this After Visit Summary.  MyChart is used to connect with patients for Virtual Visits (Telemedicine).  Patients are able to view lab/test results, encounter notes, upcoming appointments, etc.  Non-urgent messages can be sent to your provider as well.   To learn more about what you can do with MyChart, go to [email or web address].   Other Instructions Please keep a BP log for 2 weeks and send by MyChart or mail.                          Name and DOB__________________________ Joshua Hoover, NP [address] [place], [place] [address]  Blood Pressure Record Sheet To take your blood pressure, you will need a blood pressure machine. You can  buy a blood pressure machine (blood pressure monitor) at your clinic, drug store, or online. When choosing one, consider: An automatic monitor that has an arm cuff. A cuff that wraps snugly around your upper arm. You should be able to fit only one finger between your arm and the cuff. A device that stores blood pressure reading results. Do not choose a monitor that measures your blood pressure from your wrist or finger. Follow your health care provider's instructions for how to take your blood pressure. To use this form: Get one reading in the morning (a.m.) 1-2 hours after you take any medicines. Get one reading in the evening (p.m.) before supper.   Blood pressure log Date: _______________________  a.m. _____________________(1st reading) HR___________            p.m. _____________________(2nd reading) HR__________  Date: _______________________  a.m. _____________________(1st reading) HR___________            p.m. _____________________(2nd reading) HR__________  Date: _______________________  a.m. _____________________(1st reading) HR___________            p.m. _____________________(2nd reading) HR__________  Date: _______________________  a.m. _____________________(1st reading) HR___________            p.m. _____________________(2nd reading) HR__________  Date: _______________________  a.m. _____________________(1st reading) HR___________            p.m. _____________________(2nd reading) HR__________  Date: _______________________  a.m. _____________________(1st reading) HR___________  p.m. _____________________(2nd reading) HR__________  Date: _______________________  a.m. _____________________(1st reading) HR___________            p.m. _____________________(2nd reading) HR__________   This information is not intended to replace advice given to you by your health care provider. Make sure you discuss any questions you have with your health care  provider. Document Revised: 08/28/2019 Document Reviewed: 08/28/2019 Elsevier Patient Education  2021 [employer].
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
## 2024-01-02 ENCOUNTER — Ambulatory Visit: Admitting: Nutrition

## 2024-01-25 ENCOUNTER — Other Ambulatory Visit: Payer: Self-pay | Admitting: Cardiology

## 2024-01-25 DIAGNOSIS — N183 Chronic kidney disease, stage 3 unspecified: Secondary | ICD-10-CM

## 2024-01-27 ENCOUNTER — Other Ambulatory Visit: Payer: Self-pay | Admitting: Cardiology

## 2024-01-27 DIAGNOSIS — N183 Chronic kidney disease, stage 3 unspecified: Secondary | ICD-10-CM

## 2024-01-29 MED ORDER — TELMISARTAN 40 MG PO TABS: 40.0000 mg | ORAL_TABLET | Freq: Every day | ORAL | 3 refills | Status: AC

## 2024-02-12 ENCOUNTER — Other Ambulatory Visit: Payer: Self-pay | Admitting: Cardiology

## 2024-03-24 LAB — COLOGUARD: COLOGUARD: NEGATIVE

## 2024-06-07 ENCOUNTER — Other Ambulatory Visit: Payer: Self-pay | Admitting: Gastroenterology

## 2024-06-07 DIAGNOSIS — R7989 Other specified abnormal findings of blood chemistry: Secondary | ICD-10-CM

## 2024-06-19 ENCOUNTER — Other Ambulatory Visit

## 2024-06-25 ENCOUNTER — Ambulatory Visit
Admission: RE | Admit: 2024-06-25 | Discharge: 2024-06-25 | Disposition: A | Source: Ambulatory Visit | Attending: Gastroenterology

## 2024-06-25 DIAGNOSIS — R7989 Other specified abnormal findings of blood chemistry: Secondary | ICD-10-CM

## 2024-06-25 MED ORDER — GADOPICLENOL 0.5 MMOL/ML IV SOLN
10.0000 mL | Freq: Once | INTRAVENOUS | Status: AC | PRN
  Administered 2024-06-25: 10 mL via INTRAVENOUS
